# Patient Record
Sex: Male | Born: 1980
Health system: Southern US, Community
[De-identification: ages and names within clinical notes are randomized; demographics above are authoritative.]

## PROBLEM LIST (undated history)

## (undated) DIAGNOSIS — K219 Gastro-esophageal reflux disease without esophagitis: Secondary | ICD-10-CM

## (undated) HISTORY — DX: Gastro-esophageal reflux disease without esophagitis: K21.9

## (undated) HISTORY — PX: KNEE SURGERY: SHX244

---

## 1999-12-14 ENCOUNTER — Encounter: Payer: Self-pay | Admitting: *Deleted

## 1999-12-14 ENCOUNTER — Emergency Department (HOSPITAL_COMMUNITY): Admission: EM | Admit: 1999-12-14 | Discharge: 1999-12-14 | Payer: Self-pay | Admitting: *Deleted

## 1999-12-15 ENCOUNTER — Emergency Department (HOSPITAL_COMMUNITY): Admission: EM | Admit: 1999-12-15 | Discharge: 1999-12-15 | Payer: Self-pay | Admitting: Emergency Medicine

## 2002-03-25 ENCOUNTER — Emergency Department (HOSPITAL_COMMUNITY): Admission: EM | Admit: 2002-03-25 | Discharge: 2002-03-25 | Payer: Self-pay | Admitting: Emergency Medicine

## 2002-03-25 ENCOUNTER — Encounter: Payer: Self-pay | Admitting: Emergency Medicine

## 2006-02-13 ENCOUNTER — Emergency Department (HOSPITAL_COMMUNITY): Admission: EM | Admit: 2006-02-13 | Discharge: 2006-02-13 | Payer: Self-pay | Admitting: Emergency Medicine

## 2006-05-25 HISTORY — PX: KNEE SURGERY: SHX244

## 2006-06-07 ENCOUNTER — Encounter: Admission: RE | Admit: 2006-06-07 | Discharge: 2006-06-07 | Payer: Self-pay | Admitting: Orthopaedic Surgery

## 2006-06-24 ENCOUNTER — Encounter: Admission: RE | Admit: 2006-06-24 | Discharge: 2006-06-24 | Payer: Self-pay | Admitting: Orthopaedic Surgery

## 2007-07-31 ENCOUNTER — Emergency Department (HOSPITAL_COMMUNITY): Admission: EM | Admit: 2007-07-31 | Discharge: 2007-07-31 | Payer: Self-pay | Admitting: Emergency Medicine

## 2007-08-18 ENCOUNTER — Ambulatory Visit (HOSPITAL_BASED_OUTPATIENT_CLINIC_OR_DEPARTMENT_OTHER): Admission: RE | Admit: 2007-08-18 | Discharge: 2007-08-18 | Payer: Self-pay | Admitting: Orthopedic Surgery
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
## 2011-02-16 LAB — POCT HEMOGLOBIN-HEMACUE: Hemoglobin: 17.3 — ABNORMAL HIGH

## 2014-12-13 ENCOUNTER — Encounter: Payer: Self-pay | Admitting: Medical

## 2014-12-13 ENCOUNTER — Ambulatory Visit (INDEPENDENT_AMBULATORY_CARE_PROVIDER_SITE_OTHER): Payer: PRIVATE HEALTH INSURANCE | Admitting: Medical

## 2014-12-13 VITALS — BP 105/72 | HR 96 | Temp 98.0°F | Ht 72.75 in | Wt 300.0 lb

## 2014-12-13 DIAGNOSIS — H6500 Acute serous otitis media, unspecified ear: Secondary | ICD-10-CM

## 2014-12-13 DIAGNOSIS — J301 Allergic rhinitis due to pollen: Secondary | ICD-10-CM | POA: Diagnosis not present

## 2014-12-13 DIAGNOSIS — Z720 Tobacco use: Secondary | ICD-10-CM | POA: Diagnosis not present

## 2014-12-13 DIAGNOSIS — J309 Allergic rhinitis, unspecified: Secondary | ICD-10-CM | POA: Insufficient documentation

## 2014-12-13 DIAGNOSIS — F17200 Nicotine dependence, unspecified, uncomplicated: Secondary | ICD-10-CM | POA: Insufficient documentation

## 2014-12-13 MED ORDER — FLUTICASONE PROPIONATE 50 MCG/ACT NA SUSP: 2.0000 | Freq: Every day | NASAL | Status: DC

## 2014-12-13 MED ORDER — LORATADINE 10 MG PO TABS: 10.0000 mg | ORAL_TABLET | Freq: Every day | ORAL | Status: DC

## 2014-12-13 MED ORDER — BUPROPION HCL ER (SR) 150 MG PO TB12: ORAL_TABLET | ORAL | Status: DC

## 2014-12-13 MED ORDER — CEFDINIR 300 MG PO CAPS: 300.0000 mg | ORAL_CAPSULE | Freq: Two times a day (BID) | ORAL | Status: DC

## 2014-12-13 NOTE — Patient Instructions (Signed)
Smoker tyring to quit and discussed wellbutrin and insrtuction how to use. No contraindications to use.  Allergic rhinitis By signs, symptoms and exam appear to have allergies. Rx claritin and flonase.   Lt OM- Rx cefdnir  Follow up in 10 days recheck ear or as needed.

## 2014-12-13 NOTE — Assessment & Plan Note (Signed)
By signs, symptoms and exam appear to have allergies. Rx claritin and flonase.
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
## 2014-12-13 NOTE — Progress Notes (Signed)
Pre visit review using our clinic review tool, if applicable. No additional management support is needed unless otherwise documented below in the visit note. 

## 2014-12-13 NOTE — Assessment & Plan Note (Signed)
tyring to quit and discussed wellbutrin and insrtuction how to use. No contraindications to use.

## 2016-09-28 ENCOUNTER — Telehealth: Payer: Self-pay | Admitting: Medical

## 2016-09-28 NOTE — Telephone Encounter (Signed)
Patient Name: Jon Sanders  DOB: 12/19/1980    Initial Comment Caller states he's having some chest discomfort. Also having knee pain. Feet are falling asleep.   Nurse Assessment  Nurse: Stefano GaulStringer, RN, Dwana CurdVera Date/Time (Eastern Time): 09/28/2016 1:19:36 PM  Confirm and document reason for call. If symptomatic, describe symptoms. ---Caller states he has chest discomfort on the right side of his chest and in the center of his chest which occurred about 15 min ago. Pain lasts about 15 seconds. No chest pain until today. Has had chest discomfort off and on since 9:30 am. Has pain in both knees. Left knee is a little swollen. feet have been going numb for a few weeks which last about 5-10 min. Knee pain can be as high as 7-8.  Does the patient have any new or worsening symptoms? ---Yes  Will a triage be completed? ---Yes  Related visit to physician within the last 2 weeks? ---No  Does the PT have any chronic conditions? (i.e. diabetes, asthma, etc.) ---No  Is this a behavioral health or substance abuse call? ---No     Guidelines    Guideline Title Affirmed Question Affirmed Notes  Chest Pain [1] Chest pain lasting <= 5 minutes AND [2] NO chest pain or cardiac symptoms now (Exceptions: pains lasting a few seconds)   Knee Pain [1] MODERATE pain (e.g., interferes with normal activities, limping) AND [2] present > 3 days    Final Disposition User   See Physician within 24 Hours Park LayneStringer, RN, Dwana CurdVera    Comments  appt scheduled for 8 am on 09/29/2016 with Ramon DredgeEdward Saguier   Referrals  REFERRED TO PCP OFFICE   Disagree/Comply: Comply    Disagree/Comply: Comply

## 2016-09-28 NOTE — Telephone Encounter (Signed)
Wife Jon Cruel(Starr) called stating patient is experiencing sharp pains in his chest. Transferred to [facility]

## 2016-09-29 ENCOUNTER — Encounter: Payer: Self-pay | Admitting: Medical

## 2016-09-29 ENCOUNTER — Ambulatory Visit (INDEPENDENT_AMBULATORY_CARE_PROVIDER_SITE_OTHER): Payer: PRIVATE HEALTH INSURANCE | Admitting: Medical

## 2016-09-29 ENCOUNTER — Ambulatory Visit (HOSPITAL_BASED_OUTPATIENT_CLINIC_OR_DEPARTMENT_OTHER)
Admission: RE | Admit: 2016-09-29 | Discharge: 2016-09-29 | Disposition: A | Discharge status: Home / Self Care | Payer: Self-pay | Source: Ambulatory Visit | Attending: Medical | Admitting: Medical

## 2016-09-29 VITALS — BP 118/85 | HR 93 | Temp 98.0°F | Resp 16 | Ht 72.0 in | Wt 274.0 lb

## 2016-09-29 DIAGNOSIS — R062 Wheezing: Secondary | ICD-10-CM

## 2016-09-29 DIAGNOSIS — M25562 Pain in left knee: Secondary | ICD-10-CM

## 2016-09-29 DIAGNOSIS — M940 Chondrocostal junction syndrome [Tietze]: Secondary | ICD-10-CM

## 2016-09-29 DIAGNOSIS — F17200 Nicotine dependence, unspecified, uncomplicated: Secondary | ICD-10-CM

## 2016-09-29 DIAGNOSIS — R0789 Other chest pain: Secondary | ICD-10-CM

## 2016-09-29 DIAGNOSIS — G8929 Other chronic pain: Secondary | ICD-10-CM

## 2016-09-29 LAB — TROPONIN I: TNIDX: 0.01 ug/l (ref 0.00–0.06)

## 2016-09-29 MED ORDER — DICLOFENAC SODIUM 75 MG PO TBEC: 75.0000 mg | DELAYED_RELEASE_TABLET | Freq: Two times a day (BID) | ORAL | 0 refills | Status: DC

## 2016-09-29 MED FILL — DICLOFENAC SOD 75 MG TAB EC: 75 | 15 days supply | Qty: 30 | Fill #0

## 2016-09-29 NOTE — Telephone Encounter (Signed)
Can wear his brace for 5-7 days. Use diclofenac and then update us on how his knee feels.

## 2016-09-29 NOTE — Patient Instructions (Signed)
For your atypical chest pain will get troponin stat today. Ekg looked normal. Your location and transient features of pain indicated more muscle, cartilage or nerve pain. If you pain is constant, pressure like or associated cardiac symptoms like as explained then ED evaluation.  Do want to assess cardiac risk factors in near future. When you get your insurance will get CPE and include lipid panel.  Smoking is one risk factor for heart and best to stop or reduce significantly. Good job on weight loss.  For atypical chest pan and knee pain rx diclofenac. No nsaids otc.  Will go ahead and get cxr today for hx of smoking and get left knee xray.  Regarding your occupation if any pain behind knees/back of leg region with calf swelling would need ultrasounds.  After knee xray if joint space narrowed consider referral to ortho  Follow up in 2-3 weeks or as needed

## 2016-09-29 NOTE — Progress Notes (Signed)
Pre visit review using our clinic review tool, if applicable. No additional management support is needed unless otherwise documented below in the visit note. 

## 2016-09-29 NOTE — Progress Notes (Signed)
Subjective:    Patient ID: Jon Sanders, male    DOB: [birth year], 36 y.o.   MRN: [id number]  HPI   Pt in with some chest discomfort that had been coming and going since yesterday. Pt states when has pain will be sharp and stinging. Will last for 2-3 seconds at most. Last time he had pain was this morning at 6:30 am. Yesterday symptoms of transient sharp pain was lasting 2-3 seconds and sharp. Would occur about every 15 minutes. No pressure sensation. No shortness. No jaw pain, no arm pain or shoulder pain.   Pt had some bilateral knee pain yesterday. Worse on left side. Pt states crepitus when he walks. This past year had off and on knee pain. Left knee swelling. Pt had arthroscopic surgery in past about 8-9 years ago.  Pt has tried to loose to help with knees. He is eating healthier.  Pt is trying to quit smoking.   Pt mom had arrythmia. Uncle had CAD. No history of sudden death on either side of family.     Review of Systems  Constitutional: Negative for chills and fatigue.  Respiratory: Negative for cough, chest tightness, shortness of breath and wheezing.   Cardiovascular: Positive for chest pain. Negative for palpitations.       Very atypical chest pain. More right side.  Gastrointestinal: Negative for abdominal pain.  Musculoskeletal: Negative for back pain, joint swelling, myalgias, neck pain and neck stiffness.  Skin: Negative for pallor and rash.  Neurological: Negative for dizziness, seizures, weakness, numbness and headaches.  Hematological: Negative for adenopathy. Does not bruise/bleed easily.  Psychiatric/Behavioral: Negative for behavioral problems and confusion.       Objective:   Physical Exam  General Mental Status- Alert. General Appearance- Not in acute distress.   Skin General: Color- Normal Color. Moisture- Normal Moisture.  Neck Carotid Arteries- Normal color. Moisture- Normal Moisture. No carotid bruits. No JVD.  Chest and Lung  Exam Auscultation: Breath Sounds:-Normal.  Cardiovascular Auscultation:Rythm- Regular. Murmurs & Other Heart Sounds:Auscultation of the heart reveals- No Murmurs.  Abdomen Inspection:-Inspeection Normal. Palpation/Percussion:Note:No mass. Palpation and Percussion of the abdomen reveal- Non Tender, Non Distended + BS, no rebound or guarding.  Neurologic Cranial Nerve exam:- CN III-XII intact(No nystagmus), symmetric smile. Strength:- 5/5 equal and symmetric strength both upper and lower extremities.  Lower ext- calfs symmetric. No pedal edema. Negative homans signs.  Anterior chest- no pain on palpation. Pt local of pain rt side costochondral junction when he has pain.     Assessment & Plan:  ekg nsr  For your atypical chest pain will get troponin stat today. Ekg looked normal. Your location and transient features of pain indicated more muscle, cartilage or nerve pain. If you pain is constant, pressure like or associated cardiac symptoms like as explained then ED evaluation.  Do want to assess cardiac risk factors in near future. When you get your insurance will get CPE and include lipid panel.  Smoking is one risk factor for heart and best to stop or reduce significantly. Good job on weight loss.  For atypical chest pan and knee pain rx diclofenac. No nsaids otc.  Will go ahead and get cxr today for hx of smoking and get left knee xray.  Regarding your occupation if any pain behind knees/back of leg region with calf swelling would need ultrasounds.  After knee xray if joint space narrowed consider referral to ortho  Follow up in 2-3 weeks or as needed  Abie Killian, Ramon DredgeEdward, VF CorporationPA-C

## 2016-09-30 ENCOUNTER — Telehealth: Payer: Self-pay | Admitting: Medical

## 2016-09-30 NOTE — Telephone Encounter (Signed)
Notified spouse of results.

## 2016-09-30 NOTE — Telephone Encounter (Signed)
Notified pt that he can wear brace for 5-7 days and to updates us on how his knee is feeling.

## 2016-09-30 NOTE — Telephone Encounter (Signed)
Pt's spouse called in to request pt's chest x-ray results   CB: [phone]

## 2017-10-19 IMAGING — DX DG KNEE 3 VIEWS*L*
3 series · 3 of 3 positions shown · non-contrast
Comparison: None

CLINICAL DATA: Chronic LEFT knee pain for over year

EXAM:
LEFT KNEE - 3 VIEW

[knee ap]
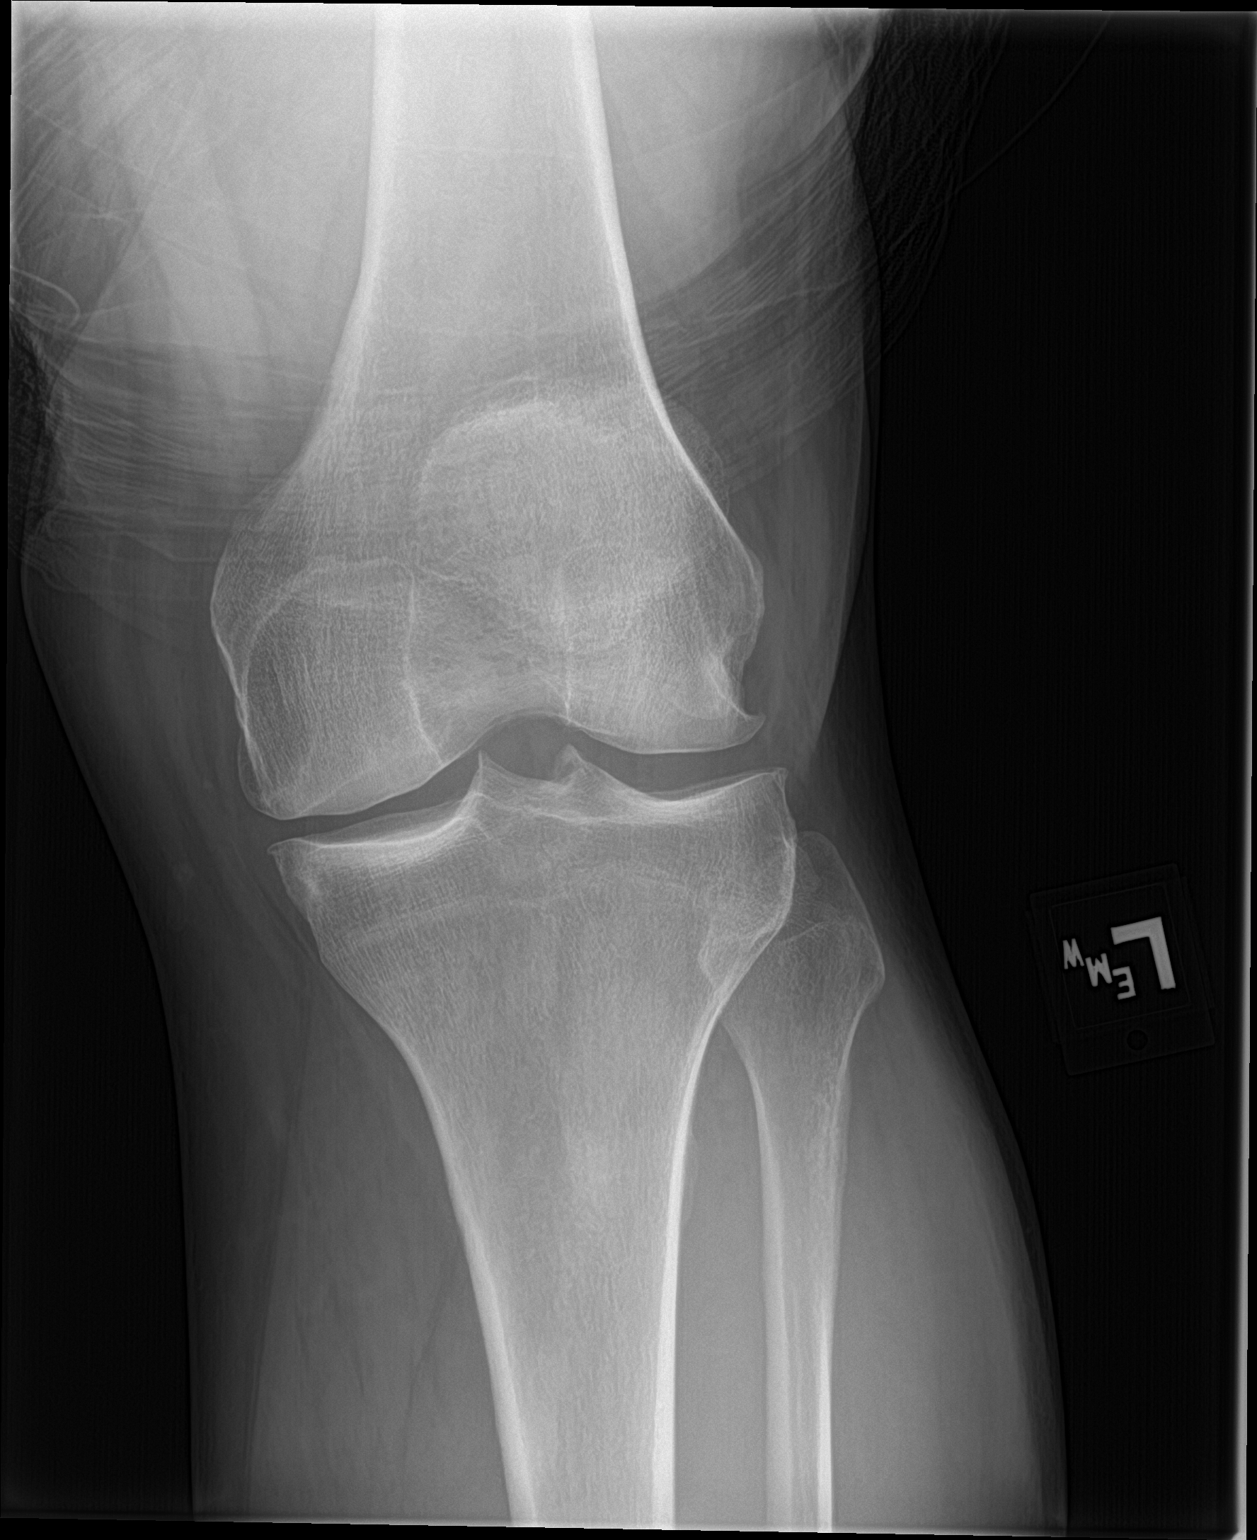

[knee lat]
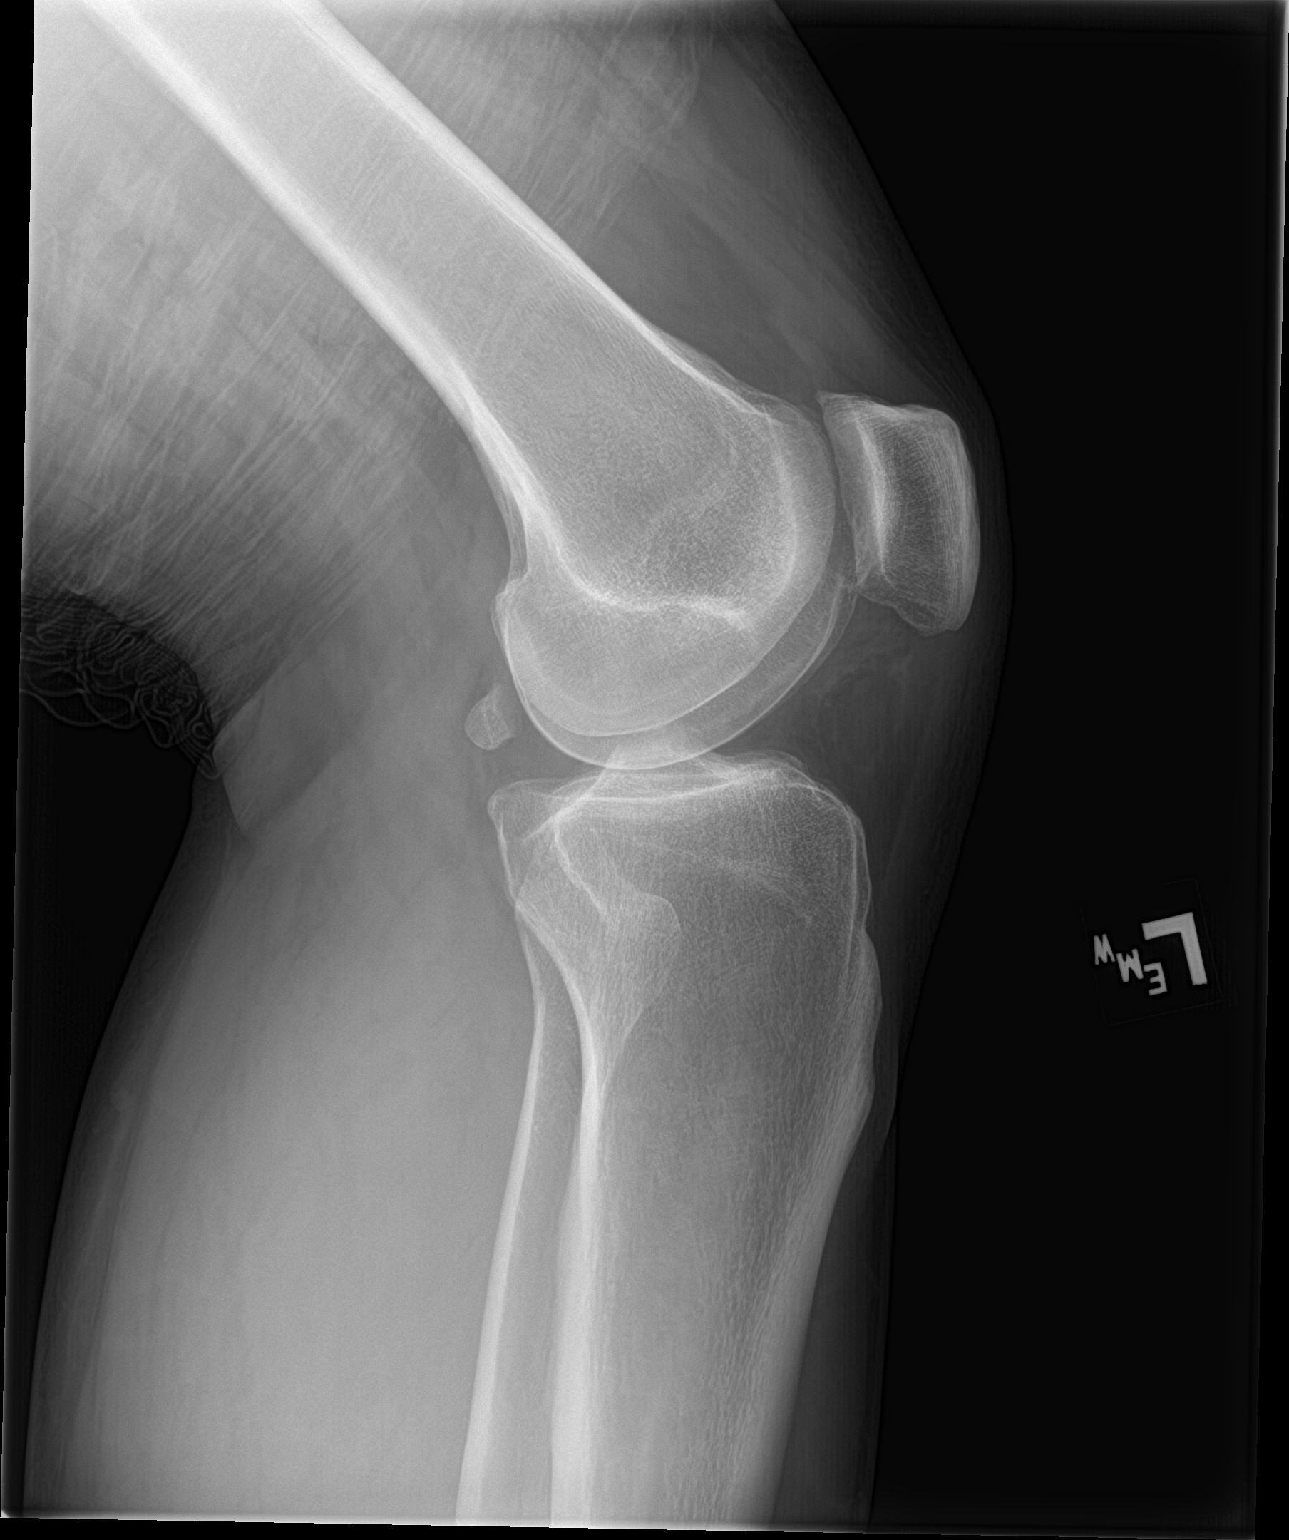

[knee sunrise]
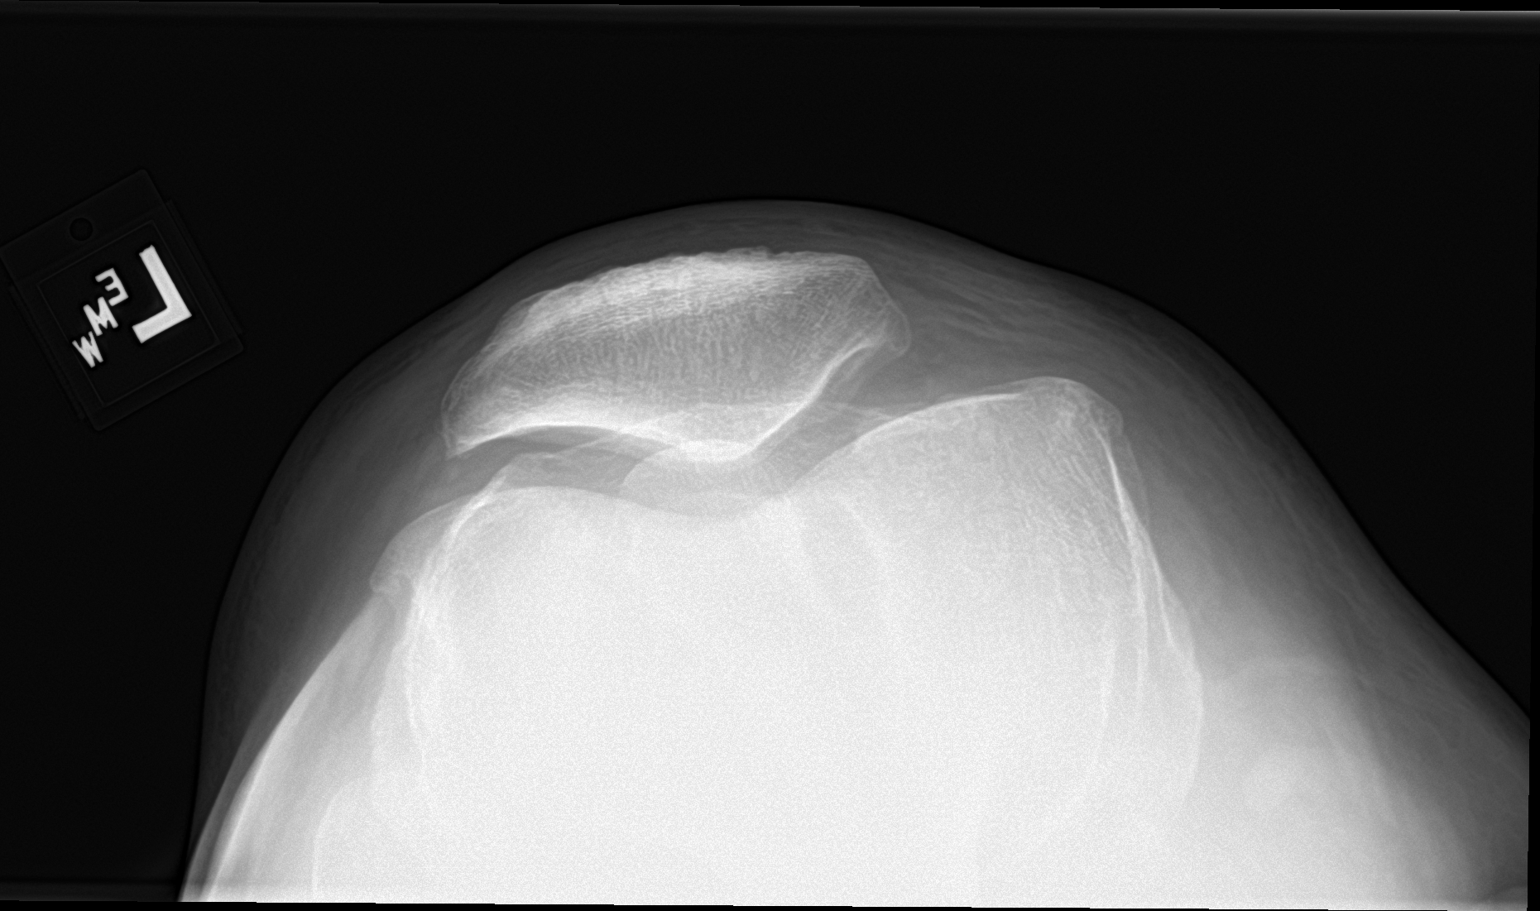

[3 of 3 positions shown; findings below may reference images not displayed]

FINDINGS: Osseous mineralization normal.

Mild joint space narrowing greatest at medial compartment.

No acute fracture, dislocation, or bone destruction.

Clothing artifacts at distal thigh.

No knee joint effusion or other regional soft tissue abnormality.
IMPRESSION: Mild degenerative changes LEFT knee.

## 2020-11-26 ENCOUNTER — Other Ambulatory Visit: Payer: Self-pay

## 2020-11-27 ENCOUNTER — Encounter: Payer: Self-pay | Admitting: Medical

## 2020-11-27 ENCOUNTER — Ambulatory Visit (INDEPENDENT_AMBULATORY_CARE_PROVIDER_SITE_OTHER): Payer: PRIVATE HEALTH INSURANCE | Admitting: Medical

## 2020-11-27 ENCOUNTER — Other Ambulatory Visit: Payer: Self-pay

## 2020-11-27 ENCOUNTER — Other Ambulatory Visit (HOSPITAL_BASED_OUTPATIENT_CLINIC_OR_DEPARTMENT_OTHER): Payer: Self-pay

## 2020-11-27 VITALS — BP 115/57 | HR 100 | Resp 18 | Ht 72.0 in | Wt 328.0 lb

## 2020-11-27 DIAGNOSIS — F17200 Nicotine dependence, unspecified, uncomplicated: Secondary | ICD-10-CM | POA: Diagnosis not present

## 2020-11-27 DIAGNOSIS — K219 Gastro-esophageal reflux disease without esophagitis: Secondary | ICD-10-CM

## 2020-11-27 DIAGNOSIS — Z23 Encounter for immunization: Secondary | ICD-10-CM

## 2020-11-27 DIAGNOSIS — H9209 Otalgia, unspecified ear: Secondary | ICD-10-CM

## 2020-11-27 DIAGNOSIS — Z0000 Encounter for general adult medical examination without abnormal findings: Secondary | ICD-10-CM

## 2020-11-27 DIAGNOSIS — Z0001 Encounter for general adult medical examination with abnormal findings: Secondary | ICD-10-CM

## 2020-11-27 DIAGNOSIS — R5383 Other fatigue: Secondary | ICD-10-CM | POA: Diagnosis not present

## 2020-11-27 DIAGNOSIS — Z7185 Encounter for immunization safety counseling: Secondary | ICD-10-CM

## 2020-11-27 DIAGNOSIS — E669 Obesity, unspecified: Secondary | ICD-10-CM

## 2020-11-27 LAB — LIPID PANEL
Cholesterol: 249 mg/dL — ABNORMAL HIGH (ref 0–200)
HDL: 45.1 mg/dL (ref >39.00)
LDL Cholesterol: 179 mg/dL — ABNORMAL HIGH (ref 0–99)
NonHDL: 204.38
Total CHOL/HDL Ratio: 6
Triglycerides: 126 mg/dL (ref 0.0–149.0)
VLDL: 25.2 mg/dL (ref 0.0–40.0)

## 2020-11-27 LAB — CBC WITH DIFFERENTIAL/PLATELET
Basophils Absolute: 0.1 10*3/uL (ref 0.0–0.1)
Basophils Relative: 0.8 % (ref 0.0–3.0)
Eosinophils Absolute: 0.2 10*3/uL (ref 0.0–0.7)
Eosinophils Relative: 1.8 % (ref 0.0–5.0)
HCT: 46.4 % (ref 39.0–52.0)
Hemoglobin: 16.2 g/dL (ref 13.0–17.0)
Lymphocytes Relative: 26.5 % (ref 12.0–46.0)
Lymphs Abs: 3.2 10*3/uL (ref 0.7–4.0)
MCHC: 34.9 g/dL (ref 30.0–36.0)
MCV: 97.2 fl (ref 78.0–100.0)
Monocytes Absolute: 0.9 10*3/uL (ref 0.1–1.0)
Monocytes Relative: 7.7 % (ref 3.0–12.0)
Neutro Abs: 7.7 10*3/uL (ref 1.4–7.7)
Neutrophils Relative %: 63.2 % (ref 43.0–77.0)
Platelets: 162 10*3/uL (ref 150.0–400.0)
RBC: 4.77 Mil/uL (ref 4.22–5.81)
RDW: 13.5 % (ref 11.5–15.5)
WBC: 12.2 10*3/uL — ABNORMAL HIGH (ref 4.0–10.5)

## 2020-11-27 LAB — COMPREHENSIVE METABOLIC PANEL
ALT: 25 U/L (ref 0–53)
AST: 19 U/L (ref 0–37)
Albumin: 4.3 g/dL (ref 3.5–5.2)
Alkaline Phosphatase: 80 U/L (ref 39–117)
BUN: 14 mg/dL (ref 6–23)
CO2: 27 mEq/L (ref 19–32)
Calcium: 9.2 mg/dL (ref 8.4–10.5)
Chloride: 104 mEq/L (ref 96–112)
Creatinine, Ser: 1.01 mg/dL (ref 0.40–1.50)
GFR: 91.76 mL/min (ref >60.00)
Glucose, Bld: 80 mg/dL (ref 70–99)
Potassium: 4.4 mEq/L (ref 3.5–5.1)
Sodium: 139 mEq/L (ref 135–145)
Total Bilirubin: 0.6 mg/dL (ref 0.2–1.2)
Total Protein: 7 g/dL (ref 6.0–8.3)

## 2020-11-27 LAB — T4, FREE: Free T4: 0.82 ng/dL (ref 0.60–1.60)

## 2020-11-27 LAB — TSH: TSH: 3.25 u[IU]/mL (ref 0.35–5.50)

## 2020-11-27 LAB — VITAMIN B12: Vitamin B-12: 293 pg/mL (ref 211–911)

## 2020-11-27 MED ORDER — NEOMYCIN-POLYMYXIN-HC 3.5-10000-1 OT SOLN
3.0000 [drp] | Freq: Three times a day (TID) | OTIC | 0 refills | Status: DC
  Filled 2020-11-27: qty 10, 22d supply, fill #0

## 2020-11-27 MED ORDER — OMEPRAZOLE 20 MG PO CPDR
20.0000 mg | DELAYED_RELEASE_CAPSULE | Freq: Every day | ORAL | 3 refills | Status: DC
  Filled 2020-11-27: qty 30, 30d supply, fill #0

## 2020-11-27 MED ORDER — AMOXICILLIN-POT CLAVULANATE 875-125 MG PO TABS
1.0000 | ORAL_TABLET | Freq: Two times a day (BID) | ORAL | 0 refills | Status: DC
  Filled 2020-11-27: qty 20, 10d supply, fill #0

## 2020-11-27 MED ORDER — BUPROPION HCL ER (XL) 150 MG PO TB24
150.0000 mg | ORAL_TABLET | Freq: Every day | ORAL | 1 refills | Status: DC
  Filled 2020-11-27: qty 30, 30d supply, fill #0
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
## 2020-11-27 NOTE — Addendum Note (Signed)
Addended by: Maximino Sarin on: 11/27/2020 10:36 AM   Modules accepted: Orders

## 2020-11-27 NOTE — Patient Instructions (Addendum)
For you wellness exam today I have ordered cbc, cmp and lipid panel.  For fatigue will get b12, b1, tsh, t4 and vit d.  Recommend exercise and healthy diet.  We will let you know lab results as they come in.  Follow up date appointment will be determined after lab review.    For smoking cessation rx wellbutrin.  For wt loss recommend [employer] app and exercise.  For gerd type symptoms rx omeprazole. If your upper throat/potential esophagus symptoms don't resolve then refer to GI for possible endoscopy.  For ear pain OM with some otitis externa rx augmentin and cortipsorin otic drops.  Follow up in 3 weeks or as needed   Preventive Care 84-21 Years Old, Male Preventive care refers to lifestyle choices and visits with your health care provider that can promote health and wellness. This includes: A yearly physical exam. This is also called an annual wellness visit. Regular dental and eye exams. Immunizations. Screening for certain conditions. Healthy lifestyle choices, such as: Eating a healthy diet. Getting regular exercise. Not using drugs or products that contain nicotine and tobacco. Limiting alcohol use. What can I expect for my preventive care visit? Physical exam Your health care provider will check your: Height and weight. These may be used to calculate your BMI (body mass index). BMI is a measurement that tells if you are at a healthy weight. Heart rate and blood pressure. Body temperature. Skin for abnormal spots. Counseling Your health care provider may ask you questions about your: Past medical problems. Family's medical history. Alcohol, tobacco, and drug use. Emotional well-being. Home life and relationship well-being. Sexual activity. Diet, exercise, and sleep habits. Work and work Astronomer. Access to firearms. What immunizations do I need?  Vaccines are usually given at various ages, according to a schedule. Your health care provider will recommend  vaccines for you based on your age, medicalhistory, and lifestyle or other factors, such as travel or where you work. What tests do I need? Blood tests Lipid and cholesterol levels. These may be checked every 5 years, or more often if you are over 61 years old. Hepatitis C test. Hepatitis B test. Screening Lung cancer screening. You may have this screening every year starting at age 59 if you have a 30-pack-year history of smoking and currently smoke or have quit within the past 15 years. Prostate cancer screening. Recommendations will vary depending on your family history and other risks. Genital exam to check for testicular cancer or hernias. Colorectal cancer screening. All adults should have this screening starting at age 92 and continuing until age 47. Your health care provider may recommend screening at age 66 if you are at increased risk. You will have tests every 1-10 years, depending on your results and the type of screening test. Diabetes screening. This is done by checking your blood sugar (glucose) after you have not eaten for a while (fasting). You may have this done every 1-3 years. STD (sexually transmitted disease) testing, if you are at risk. Follow these instructions at home: Eating and drinking  Eat a diet that includes fresh fruits and vegetables, whole grains, lean protein, and low-fat dairy products. Take vitamin and mineral supplements as recommended by your health care provider. Do not drink alcohol if your health care provider tells you not to drink. If you drink alcohol: Limit how much you have to 0-2 drinks a day. Be aware of how much alcohol is in your drink. In the [place] one drink equals one 12  oz bottle of beer (355 mL), one 5 oz glass of wine (148 mL), or one 1 oz glass of hard liquor (44 mL).  Lifestyle Take daily care of your teeth and gums. Brush your teeth every morning and night with fluoride toothpaste. Floss one time each day. Stay active. Exercise  for at least 30 minutes 5 or more days each week. Do not use any products that contain nicotine or tobacco, such as cigarettes, e-cigarettes, and chewing tobacco. If you need help quitting, ask your health care provider. Do not use drugs. If you are sexually active, practice safe sex. Use a condom or other form of protection to prevent STIs (sexually transmitted infections). If told by your health care provider, take low-dose aspirin daily starting at age 1. Find healthy ways to cope with stress, such as: Meditation, yoga, or listening to music. Journaling. Talking to a trusted person. Spending time with friends and family. Safety Always wear your seat belt while driving or riding in a vehicle. Do not drive: If you have been drinking alcohol. Do not ride with someone who has been drinking. When you are tired or distracted. While texting. Wear a helmet and other protective equipment during sports activities. If you have firearms in your house, make sure you follow all gun safety procedures. What's next? Go to your health care provider once a year for an annual wellness visit. Ask your health care provider how often you should have your eyes and teeth checked. Stay up to date on all vaccines. This information is not intended to replace advice given to you by your health care provider. Make sure you discuss any questions you have with your healthcare provider. Document Revised: 02/07/2019 Document Reviewed: 05/05/2018 Elsevier Patient Education  2022 [employer].

## 2020-11-29 ENCOUNTER — Encounter: Payer: Self-pay | Admitting: Medical

## 2020-11-30 LAB — VITAMIN D 1,25 DIHYDROXY
Vitamin D 1, 25 (OH)2 Total: 39 pg/mL (ref 18–72)
Vitamin D2 1, 25 (OH)2: <8 pg/mL
Vitamin D3 1, 25 (OH)2: 39 pg/mL

## 2020-12-01 LAB — VITAMIN B1: Vitamin B1 (Thiamine): 7 nmol/L — ABNORMAL LOW (ref 8–30)

## 2020-12-02 ENCOUNTER — Encounter: Payer: Self-pay | Admitting: Medical

## 2020-12-06 NOTE — Addendum Note (Signed)
Addended by: Gwenevere Abbot on: 12/06/2020 11:11 PM   Modules accepted: Orders

## 2020-12-13 ENCOUNTER — Encounter: Payer: Self-pay | Admitting: Medical

## 2020-12-19 ENCOUNTER — Ambulatory Visit: Payer: PRIVATE HEALTH INSURANCE | Admitting: Medical

## 2020-12-24 ENCOUNTER — Ambulatory Visit (INDEPENDENT_AMBULATORY_CARE_PROVIDER_SITE_OTHER): Payer: Self-pay | Admitting: Family Medicine

## 2020-12-27 ENCOUNTER — Other Ambulatory Visit (HOSPITAL_BASED_OUTPATIENT_CLINIC_OR_DEPARTMENT_OTHER): Payer: Self-pay

## 2021-01-02 ENCOUNTER — Ambulatory Visit: Payer: PRIVATE HEALTH INSURANCE | Admitting: Medical

## 2021-01-07 ENCOUNTER — Ambulatory Visit (INDEPENDENT_AMBULATORY_CARE_PROVIDER_SITE_OTHER): Payer: Self-pay | Admitting: Family Medicine

## 2021-01-15 ENCOUNTER — Other Ambulatory Visit (HOSPITAL_BASED_OUTPATIENT_CLINIC_OR_DEPARTMENT_OTHER): Payer: Self-pay

## 2021-01-15 ENCOUNTER — Telehealth (INDEPENDENT_AMBULATORY_CARE_PROVIDER_SITE_OTHER): Payer: PRIVATE HEALTH INSURANCE | Admitting: Medical

## 2021-01-15 ENCOUNTER — Other Ambulatory Visit: Payer: Self-pay

## 2021-01-15 DIAGNOSIS — H9202 Otalgia, left ear: Secondary | ICD-10-CM | POA: Diagnosis not present

## 2021-01-15 DIAGNOSIS — E7849 Other hyperlipidemia: Secondary | ICD-10-CM

## 2021-01-15 DIAGNOSIS — R5383 Other fatigue: Secondary | ICD-10-CM | POA: Diagnosis not present

## 2021-01-15 DIAGNOSIS — K219 Gastro-esophageal reflux disease without esophagitis: Secondary | ICD-10-CM

## 2021-01-15 DIAGNOSIS — E669 Obesity, unspecified: Secondary | ICD-10-CM | POA: Diagnosis not present

## 2021-01-15 MED ORDER — BUPROPION HCL ER (XL) 150 MG PO TB24
150.0000 mg | ORAL_TABLET | Freq: Every day | ORAL | 1 refills | Status: DC
  Filled 2021-01-15: qty 30, 30d supply, fill #0

## 2021-01-15 NOTE — Progress Notes (Signed)
   Subjective:    Patient ID: Jon Sanders, male    DOB: 1981-03-20, 40 y.o.   MRN: [id number]  HPI  Virtual Visit via Video Note  I connected with Jon Sanders on 01/15/21 at 10:40 AM EDT by a video enabled telemedicine application and verified that I am speaking with the correct person using two identifiers.  Location: Patient: home Provider: office   I discussed the limitations of evaluation and management by telemedicine and the availability of in person appointments. The patient expressed understanding and agreed to proceed.  History of Present Illness:  Pt states his gerd symptoms did resolve with omeprazole.  Pt states that his ear pain did not get better(feels the same). I had given augmentin and cortipsorin otic drops. Pt mentions history of rt side tmj area pain. Pt has seen dentist about cracked tooth left side. About 1.5 inches from the tmj region. Told by dentist no infection. Pt does chew candies. Some ice chewing.   Pt is still smoking. He is on wellbutrin. He smokes about 1/2-1 pack a day. At one point was 2 packs. Pt states mood feels better with wellbutrin.  Pt states fatigue is less/better energy with b1 and b12.  Pt  is working on low cholesterol diet.  Pt has gained weight despite loss. Failed ww effort.    Observations/Objective:  General-no acute distress, pleasant, oriented. Lungs- on inspection lungs appear unlabored. Neck- no tracheal deviation or jvd on inspection. Neuro- gross motor function appears intact.    Assessment and Plan: Fatigue improving since use of b12 and b1 vitamin otc. Continue those daily and recheck those level in 2 months.  Left ear pain persist despite tx. Will refer to ent for evaluation. TMJ and cracked tooth in differential but since you don't report severe pain in tooth or tmj region will refer to ent. Get opinion from dentist about tmj joint region appearance on recent xray.   Continue wellbutrin for smoking  cessation.  Low cholesterol diet and recheck cmp with lipid panel in 2 months.  For wt loss referred to wt management clinic.  Follow up date to be determined after lab review in 2 months or sooner if needed  Esperanza Richters, PA-C   Follow Up Instructions:    I discussed the assessment and treatment plan with the patient. The patient was provided an opportunity to ask questions and all were answered. The patient agreed with the plan and demonstrated an understanding of the instructions.   The patient was advised to call back or seek an in-person evaluation if the symptoms worsen or if the condition fails to improve as anticipated.   Time spent with patient today was  32 minutes which consisted of chart review, discussing diagnosis, work up treatment and documentation.   Esperanza Richters, PA-C   Review of Systems     Objective:   Physical Exam        Assessment & Plan:

## 2021-01-15 NOTE — Patient Instructions (Addendum)
Fatigue improving since use of b12 and b1 vitamin otc. Continue those daily and recheck those level in 2 months.  Left ear pain persist despite tx. Will refer to ent for evaluation. TMJ and cracked tooth in differential but since you don't report severe pain in tooth or tmj region will refer to ent. Get opinion from dentist about tmj joint region appearance on recent xray.   Continue wellbutrin for smoking cessation.  Low cholesterol diet and recheck cmp with lipid panel in 2 months.  For wt loss referred to wt management clinic.  For gerd continue omeprazole.  Follow up date to be determined after lab review in 2 months or sooner if needed

## 2021-01-28 ENCOUNTER — Other Ambulatory Visit (HOSPITAL_BASED_OUTPATIENT_CLINIC_OR_DEPARTMENT_OTHER): Payer: Self-pay

## 2021-01-31 ENCOUNTER — Other Ambulatory Visit: Payer: Self-pay | Admitting: Medical

## 2021-05-28 ENCOUNTER — Encounter: Payer: Self-pay | Admitting: Medical

## 2021-05-28 ENCOUNTER — Other Ambulatory Visit: Payer: Self-pay | Admitting: Medical

## 2021-05-28 MED ORDER — BUPROPION HCL ER (XL) 150 MG PO TB24: 150.0000 mg | ORAL_TABLET | Freq: Every day | ORAL | 1 refills | Status: DC

## 2021-05-28 MED ORDER — OMEPRAZOLE 20 MG PO CPDR: 20.0000 mg | DELAYED_RELEASE_CAPSULE | Freq: Every day | ORAL | 3 refills | Status: DC

## 2021-12-31 ENCOUNTER — Encounter (INDEPENDENT_AMBULATORY_CARE_PROVIDER_SITE_OTHER): Payer: Self-pay

## 2023-03-12 ENCOUNTER — Ambulatory Visit: Payer: Medicaid Other | Admitting: Medical

## 2023-03-19 ENCOUNTER — Encounter: Payer: Self-pay | Admitting: Medical

## 2023-03-19 ENCOUNTER — Ambulatory Visit: Payer: Medicaid Other | Admitting: Medical

## 2023-03-19 VITALS — BP 114/76 | HR 87 | Temp 97.7°F | Ht 72.0 in | Wt 333.0 lb

## 2023-03-19 DIAGNOSIS — Z6841 Body Mass Index (BMI) 40.0 and over, adult: Secondary | ICD-10-CM

## 2023-03-19 DIAGNOSIS — E669 Obesity, unspecified: Secondary | ICD-10-CM

## 2023-03-19 DIAGNOSIS — R5383 Other fatigue: Secondary | ICD-10-CM

## 2023-03-19 DIAGNOSIS — G629 Polyneuropathy, unspecified: Secondary | ICD-10-CM

## 2023-03-19 DIAGNOSIS — Z0000 Encounter for general adult medical examination without abnormal findings: Secondary | ICD-10-CM

## 2023-03-19 NOTE — Patient Instructions (Addendum)
For you wellness exam today I have ordered cbc, cmp, and  lipid panel. Future labs within a week but fating  Vaccine declined  Recommend exercise and healthy diet.  We will let you know lab results as they come in.  Follow up date appointment will be determined after lab review.     Obesity Significant weight loss (35 lbs) over the past 3 months through calorie restriction ([phone] kcal/day), increased fluid intake, reduced sugar and greasy food intake, and increased physical activity. Noted improved mobility and energy levels. -Continue current diet and exercise regimen.  Tobacco Use Transitioned from smoking 2-3 packs of cigarettes daily to vaping in November of last year. -Encouraged to continue efforts to quit nicotine entirely. But better than smoking such large amounts. consider in future stop vaping.  Foot Pain Reports nerve-like pain on the top of the foot for the past month, particularly while driving. -Z61, b1 and tsh to start work up.   Your weight is different at home. Check your scale against wt. If accurate let me know will add cxr and bnp.  Follow up date to be determined after lab review.   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 -Continue current weight loss efforts to improve overall health status for DOT physical.

## 2023-03-19 NOTE — Progress Notes (Signed)
Subjective:    Patient ID: Jon Sanders, male    DOB: 19-Sep-1980, 42 y.o.   MRN: [id number]  HPI Decided to wellness exam today. 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 Healthy diet now, married- no children.Not drinking any alcohol. Now vaping  Flu  vaccine declined  Discussed the use of AI scribe software for clinical note transcription with the patient, who gave verbal consent to proceed.  History of Present Illness   The patient, a truck driver with obesity, has been actively trying to lose weight over the past three months. They have successfully lost 35 pounds through calorie counting, increased fluid intake, reduced sugar and greasy food consumption, and maintaining a regular eating schedule. Despite the constraints of their job, they have incorporated small workouts into their daily routine, such as walking around the truck and manually adjusting the landing gears. Their daily caloric intake ranges from 900 to 1500 calories, depending on their appetite.  In addition to their weight loss efforts, the patient has also made changes to their smoking habits. They transitioned from smoking two to three packs of cigarettes a day to vaping since November of the previous year.  The patient has noticed some improvements in their mobility since losing weight, such as being able to get up faster. However, they have also been experiencing a nerve-like pain on the top of their foot for about a month, which occurs while driving. Despite their weight loss, they still experience occasional fatigue.       Review of Systems  Constitutional:  Positive for fatigue.  HENT:  Negative for congestion.   Respiratory:  Negative for cough, chest tightness and wheezing.   Cardiovascular:  Negative for chest pain and palpitations.  Gastrointestinal:  Negative for abdominal pain, diarrhea and nausea.  Musculoskeletal:  Negative for back pain.  Neurological:         Neuropathy like pain.  Psychiatric/Behavioral:  Negative for dysphoric mood and suicidal ideas. The patient is not nervous/anxious.     No past medical history on file.   Social History   Socioeconomic History   Marital status: Married    Spouse name: Not on file   Number of children: Not on file   Years of education: Not on file   Highest education level: Not on file  Occupational History   Not on file  Tobacco Use   Smoking status: Every Day    Current packs/day: 1.00    Average packs/day: 1 pack/day for 20.0 years (20.0 ttl pk-yrs)    Types: Cigarettes   Smokeless tobacco: Never  Vaping Use   Vaping status: Never Used  Substance and Sexual Activity   Alcohol use: No    Alcohol/week: 0.0 standard drinks of alcohol   Drug use: No   Sexual activity: Yes  Other Topics Concern   Not on file  Social History Narrative   Not on file   Social Determinants of Health   Financial Resource Strain: Not on file  Food Insecurity: Not on file  Transportation Needs: Not on file  Physical Activity: Not on file  Stress: Not on file  Social Connections: Unknown (09/26/2021)   Received from [facility]   Social Network    Social Network: Not on file  Intimate Partner Violence: Unknown (08/25/2021)   Received from [facility]   HITS    Physically Hurt: Not on file    Insult or Talk Down To: Not on file  Threaten Physical Harm: Not on file    Scream or Curse: Not on file    Past Surgical History:  Procedure Laterality Date   KNEE SURGERY     Left    Family History  Problem Relation Age of Onset   Heart disease Mother     No Known Allergies  Current Outpatient Medications on File Prior to Visit  Medication Sig Dispense Refill   omeprazole (PRILOSEC) 20 MG capsule Take 1 capsule (20 mg total) by mouth daily. 30 capsule 3   No current facility-administered medications on file prior to visit.    BP 114/76   Pulse 87   Temp 97.7 F (36.5 C)   Ht 6' (1.829 m)    Wt (!) 333 lb (151 kg)   SpO2 97%   BMI 45.16 kg/m        Objective:   Physical Exam  General Mental Status- Alert. General Appearance- Not in acute distress.   Skin General: Color- Normal Color. Moisture- Normal Moisture.  Neck No thyromegaly  Chest and Lung Exam Auscultation: Breath Sounds:-Normal.  Cardiovascular Auscultation:Rythm- Regular. Murmurs & Other Heart Sounds:Auscultation of the heart reveals- No Murmurs.  Abdomen Inspection:-Inspeection Normal. Palpation/Percussion:Note:No mass. Palpation and Percussion of the abdomen reveal- Non Tender, Non Distended + BS, no rebound or guarding.   Neurologic Cranial Nerve exam:- CN III-XII intact(No nystagmus), symmetric smile. Strength:- 5/5 equal and symmetric strength both upper and lower extremities.   Lower ext- calfs not swollen. Symmetric, negative homans signs bilaterall.      Assessment & Plan:   Assessment and Plan    Patient Instructions  For you wellness exam today I have ordered cbc, cmp, and  lipid panel. Future labs within a week but fating  Vaccine declined  Recommend exercise and healthy diet.  We will let you know lab results as they come in.  Follow up date appointment will be determined after lab review.     Obesity Significant weight loss (35 lbs) over the past 3 months through calorie restriction ([id number] kcal/day), increased fluid intake, reduced sugar and greasy food intake, and increased physical activity. Noted improved mobility and energy levels. -Continue current diet and exercise regimen.  Tobacco Use Transitioned from smoking 2-3 packs of cigarettes daily to vaping in November of last year. -Encouraged to continue efforts to quit nicotine entirely. But better than smoking such large amounts. consider in future stop vaping.  Foot Pain Reports nerve-like pain on the top of the foot for the past month, particularly while driving. -V78, b1 and tsh to start work  up.   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 -Continue current weight loss efforts to improve overall health status for DOT physical.     Your weight is different at home. Check your scale against wt. If accurate let me know will add cxr and bnp.  Follow up date to be determined after lab review.   46962 charge as did address/discuss obesity, neuropathy like pain and fatigue.

## 2023-03-23 ENCOUNTER — Encounter: Payer: Self-pay | Admitting: Medical

## 2023-04-01 ENCOUNTER — Encounter: Payer: Self-pay | Admitting: Medical

## 2023-04-01 MED ORDER — METFORMIN HCL 500 MG PO TABS
500.0000 mg | ORAL_TABLET | Freq: Two times a day (BID) | ORAL | 0 refills | Status: DC
Start: 2023-04-01 — End: 2023-04-20

## 2023-04-01 NOTE — Addendum Note (Signed)
Addended by: Gwenevere Abbot on: 04/01/2023 08:10 PM   Modules accepted: Orders

## 2023-04-04 ENCOUNTER — Encounter (INDEPENDENT_AMBULATORY_CARE_PROVIDER_SITE_OTHER): Payer: Self-pay

## 2023-04-08 ENCOUNTER — Other Ambulatory Visit (INDEPENDENT_AMBULATORY_CARE_PROVIDER_SITE_OTHER): Payer: Medicaid Other

## 2023-04-08 DIAGNOSIS — G629 Polyneuropathy, unspecified: Secondary | ICD-10-CM | POA: Diagnosis not present

## 2023-04-08 DIAGNOSIS — Z0000 Encounter for general adult medical examination without abnormal findings: Secondary | ICD-10-CM

## 2023-04-08 DIAGNOSIS — R5383 Other fatigue: Secondary | ICD-10-CM

## 2023-04-08 LAB — LIPID PANEL
Cholesterol: 166 mg/dL (ref 0–200)
HDL: 32.1 mg/dL — ABNORMAL LOW (ref >39.00)
LDL Cholesterol: 113 mg/dL — ABNORMAL HIGH (ref 0–99)
NonHDL: 133.8
Total CHOL/HDL Ratio: 5
Triglycerides: 106 mg/dL (ref 0.0–149.0)
VLDL: 21.2 mg/dL (ref 0.0–40.0)

## 2023-04-08 LAB — COMPREHENSIVE METABOLIC PANEL
ALT: 26 U/L (ref 0–53)
AST: 17 U/L (ref 0–37)
Albumin: 4.2 g/dL (ref 3.5–5.2)
Alkaline Phosphatase: 80 U/L (ref 39–117)
BUN: 12 mg/dL (ref 6–23)
CO2: 28 meq/L (ref 19–32)
Calcium: 9.2 mg/dL (ref 8.4–10.5)
Chloride: 105 meq/L (ref 96–112)
Creatinine, Ser: 1.09 mg/dL (ref 0.40–1.50)
GFR: 82.36 mL/min (ref >60.00)
Glucose, Bld: 96 mg/dL (ref 70–99)
Potassium: 4 meq/L (ref 3.5–5.1)
Sodium: 141 meq/L (ref 135–145)
Total Bilirubin: 0.4 mg/dL (ref 0.2–1.2)
Total Protein: 6.9 g/dL (ref 6.0–8.3)

## 2023-04-08 LAB — CBC WITH DIFFERENTIAL/PLATELET
Basophils Absolute: 0.1 10*3/uL (ref 0.0–0.1)
Basophils Relative: 0.9 % (ref 0.0–3.0)
Eosinophils Absolute: 0.3 10*3/uL (ref 0.0–0.7)
Eosinophils Relative: 2.9 % (ref 0.0–5.0)
HCT: 46.9 % (ref 39.0–52.0)
Hemoglobin: 15.7 g/dL (ref 13.0–17.0)
Lymphocytes Relative: 28.6 % (ref 12.0–46.0)
Lymphs Abs: 2.8 10*3/uL (ref 0.7–4.0)
MCHC: 33.4 g/dL (ref 30.0–36.0)
MCV: 96.6 fL (ref 78.0–100.0)
Monocytes Absolute: 0.7 10*3/uL (ref 0.1–1.0)
Monocytes Relative: 7.5 % (ref 3.0–12.0)
Neutro Abs: 5.9 10*3/uL (ref 1.4–7.7)
Neutrophils Relative %: 60.1 % (ref 43.0–77.0)
Platelets: 194 10*3/uL (ref 150.0–400.0)
RBC: 4.86 Mil/uL (ref 4.22–5.81)
RDW: 12.9 % (ref 11.5–15.5)
WBC: 9.9 10*3/uL (ref 4.0–10.5)

## 2023-04-08 LAB — TSH: TSH: 1.03 u[IU]/mL (ref 0.35–5.50)

## 2023-04-08 LAB — VITAMIN B12: Vitamin B-12: 566 pg/mL (ref 211–911)

## 2023-04-12 LAB — VITAMIN B1: Vitamin B1 (Thiamine): 10 nmol/L (ref 8–30)

## 2023-04-20 ENCOUNTER — Other Ambulatory Visit (HOSPITAL_BASED_OUTPATIENT_CLINIC_OR_DEPARTMENT_OTHER): Payer: Self-pay

## 2023-04-20 MED ORDER — METFORMIN HCL 500 MG PO TABS
500.0000 mg | ORAL_TABLET | Freq: Two times a day (BID) | ORAL | 0 refills | Status: DC
  Filled 2023-04-20: qty 30, 15d supply, fill #0

## 2023-04-21 ENCOUNTER — Other Ambulatory Visit (HOSPITAL_BASED_OUTPATIENT_CLINIC_OR_DEPARTMENT_OTHER): Payer: Self-pay

## 2023-04-21 MED ORDER — METFORMIN HCL 500 MG PO TABS: 500.0000 mg | ORAL_TABLET | Freq: Two times a day (BID) | ORAL | 0 refills | Status: DC

## 2023-04-21 NOTE — Addendum Note (Signed)
Addended by: Maximino Sarin on: 04/21/2023 09:49 AM   Modules accepted: Orders

## 2023-05-05 ENCOUNTER — Ambulatory Visit: Payer: Medicaid Other | Admitting: Medical

## 2023-05-05 VITALS — BP 116/60 | HR 78 | Resp 18 | Ht 72.0 in | Wt 314.0 lb

## 2023-05-05 DIAGNOSIS — R5383 Other fatigue: Secondary | ICD-10-CM

## 2023-05-05 DIAGNOSIS — Z6841 Body Mass Index (BMI) 40.0 and over, adult: Secondary | ICD-10-CM | POA: Diagnosis not present

## 2023-05-05 DIAGNOSIS — E669 Obesity, unspecified: Secondary | ICD-10-CM

## 2023-05-05 MED ORDER — SEMAGLUTIDE-WEIGHT MANAGEMENT 0.25 MG/0.5ML ~~LOC~~ SOAJ: 0.2500 mg | SUBCUTANEOUS | 0 refills | Status: DC

## 2023-05-05 NOTE — Progress Notes (Signed)
Subjective:    Patient ID: Jon Sanders, male    DOB: 04-03-1981, 42 y.o.   MRN: [id number]  HPI Discussed the use of AI scribe software for clinical note transcription with the patient, who gave verbal consent to proceed.  History of Present Illness   The patient, with a history of obesity, has been actively trying to lose weight. He has made significant lifestyle changes, including reducing his food intake to one meal a day and incorporating protein shakes into his diet. He has seen some success with this regimen, reporting a weight loss from 333 lbs to 314 lbs.  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 He has been taking metformin, which he believes may be contributing to his tiredness. The fatigue is most pronounced at the end of his shift and after taking his second dose of metformin.  The patient has a family history of hypothyroidism but denies any personal history of pancreatitis or fh thyroid cancer. He has expressed interest in trying Wegovy to aid in his weight loss efforts.        No past medical history on file.   Social History   Socioeconomic History   Marital status: Married    Spouse name: Not on file   Number of children: Not on file   Years of education: Not on file   Highest education level: Not on file  Occupational History   Not on file  Tobacco Use   Smoking status: Every Day    Current packs/day: 1.00    Average packs/day: 1 pack/day for 20.0 years (20.0 ttl pk-yrs)    Types: Cigarettes   Smokeless tobacco: Never  Vaping Use   Vaping status: Never Used  Substance and Sexual Activity   Alcohol use: No    Alcohol/week: 0.0 standard drinks of alcohol   Drug use: No   Sexual activity: Yes  Other Topics Concern   Not on file  Social History Narrative   Not on file   Social Determinants of Health   Financial Resource Strain: Not on file  Food Insecurity: Not on file   Transportation Needs: Not on file  Physical Activity: Not on file  Stress: Not on file  Social Connections: Unknown (09/26/2021)   Received from [facility]   Social Network    Social Network: Not on file  Intimate Partner Violence: Unknown (08/25/2021)   Received from [facility]   HITS    Physically Hurt: Not on file    Insult or Talk Down To: Not on file    Threaten Physical Harm: Not on file    Scream or Curse: Not on file    Past Surgical History:  Procedure Laterality Date   KNEE SURGERY     Left    Family History  Problem Relation Age of Onset   Heart disease Mother     No Known Allergies  Current Outpatient Medications on File Prior to Visit  Medication Sig Dispense Refill   metFORMIN (GLUCOPHAGE) 500 MG tablet Take 1 tablet (500 mg total) by mouth 2 (two) times daily with a meal. 30 tablet 0   omeprazole (PRILOSEC) 20 MG capsule Take 1 capsule (20 mg total) by mouth daily. 30 capsule 3   No current facility-administered medications on file prior to visit.    BP 116/60   Pulse 78   Resp 18   Ht 6' (1.829 m)   Wt (!) 314 lb (  142.4 kg)   SpO2 90%   BMI 42.59 kg/m     Review of Systems See hpi     Objective:   Physical Exam  General Mental Status- Alert. General Appearance- Not in acute distress.    Neck Carotid Arteries- Normal color. Moisture- Normal Moisture. No carotid bruits. No JVD.  Chest and Lung Exam Auscultation: Breath Sounds:-Normal.  Cardiovascular Auscultation:Rythm- Regular. Murmurs & Other Heart Sounds:Auscultation of the heart reveals- No Murmurs.    Neurologic Cranial Nerve exam:- CN III-XII intact(No nystagmus), symmetric smile. Strength:- 5/5 equal and symmetric strength both upper and lower extremities.       Assessment & Plan:   Assessment and Plan    Obesity Significant weight loss (19 lbs) with lifestyle modifications including diet and exercise. 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 No symptoms of hypoglycemia reported. BMI decreased from 45 to 42. -Continue current lifestyle modifications. -Consider adding healthy snacks every 5 hours during work shift. -Obtain glucometer and check blood glucose if feeling lightheaded or weak at work. -Consider Wegovy 0.25 mg injection weekly, pending insurance approval.  Fatigue, particularly towards the end of 10 hour work shifts(describes hard work). No symptoms of hypoglycemia reported.(labs in november did not reveal cause for fatigue.  -Continue Metformin. -Check blood glucose if feeling lightheaded or weak at work. -If wegovy covered then metformin.  General Health Maintenance -Continue monitoring weight and BMI. -Check blood glucose if feeling lightheaded or weak at work.   Follow up date to be determined/depending on wegovy start date.       Esperanza Richters, PA-C

## 2023-05-05 NOTE — Patient Instructions (Signed)
Obesity Significant weight loss (19 lbs) with lifestyle modifications including diet and exercise. 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 No symptoms of hypoglycemia reported. BMI decreased from 45 to 42. -Continue current lifestyle modifications. -Consider adding healthy snacks every 5 hours during work shift. -Obtain glucometer and check blood glucose if feeling lightheaded or weak at work. -Consider Wegovy 0.25 mg injection weekly, pending insurance approval.  Fatigue, particularly towards the end of 10 hour work shifts(describes hard work). No symptoms of hypoglycemia reported.(labs in november did not reveal cause for fatigue.  -Continue Metformin. -Check blood glucose if feeling lightheaded or weak at work. -If wegovy covered then metformin.  General Health Maintenance -Continue monitoring weight and BMI. -Check blood glucose if feeling lightheaded or weak at work.   Follow up date to be determined/depending on wegovy start date.

## 2023-05-06 ENCOUNTER — Other Ambulatory Visit (HOSPITAL_BASED_OUTPATIENT_CLINIC_OR_DEPARTMENT_OTHER): Payer: Self-pay

## 2023-05-06 ENCOUNTER — Telehealth: Payer: Self-pay

## 2023-05-06 MED ORDER — WEGOVY 0.25 MG/0.5ML ~~LOC~~ SOAJ
0.2500 mg | SUBCUTANEOUS | 0 refills | Status: DC
  Filled 2023-05-06: qty 2, 28d supply, fill #0

## 2023-05-06 NOTE — Telephone Encounter (Signed)
PA initiated via Covermymeds; KEY:  [id number]. Awaiting determination.

## 2023-05-06 NOTE — Telephone Encounter (Signed)
Approved 05/06/2023 - 11/02/2023

## 2023-05-28 ENCOUNTER — Other Ambulatory Visit (HOSPITAL_BASED_OUTPATIENT_CLINIC_OR_DEPARTMENT_OTHER): Payer: Self-pay

## 2023-05-28 ENCOUNTER — Ambulatory Visit: Payer: Medicaid Other | Admitting: Medical

## 2023-05-28 ENCOUNTER — Encounter: Payer: Self-pay | Admitting: Medical

## 2023-05-28 ENCOUNTER — Other Ambulatory Visit (HOSPITAL_COMMUNITY): Payer: Self-pay

## 2023-05-28 VITALS — BP 110/70 | HR 93 | Temp 98.0°F | Resp 18 | Ht 72.0 in | Wt 311.2 lb

## 2023-05-28 DIAGNOSIS — E669 Obesity, unspecified: Secondary | ICD-10-CM | POA: Diagnosis not present

## 2023-05-28 DIAGNOSIS — Z125 Encounter for screening for malignant neoplasm of prostate: Secondary | ICD-10-CM | POA: Diagnosis not present

## 2023-05-28 DIAGNOSIS — K5900 Constipation, unspecified: Secondary | ICD-10-CM

## 2023-05-28 DIAGNOSIS — Z1211 Encounter for screening for malignant neoplasm of colon: Secondary | ICD-10-CM

## 2023-05-28 MED ORDER — WEGOVY 0.5 MG/0.5ML ~~LOC~~ SOAJ
0.5000 mg | SUBCUTANEOUS | 0 refills | Status: DC
  Filled 2023-05-28 (×2): qty 2, 28d supply, fill #0

## 2023-05-28 NOTE — Progress Notes (Signed)
 Subjective:    Patient ID: Jon Sanders, male    DOB: 1980-06-03, 43 y.o.   MRN: [id number]  HPI  Discussed the use of AI scribe software for clinical note transcription with the patient, who gave verbal consent to proceed.  History of Present Illness   The patient, with a history of weight management issues and recently started on Wegovy , presents with concerns of decreased bowel movements. They report a frequency of approximately once every four days, which may be to a low intake of food. The stool consistency was not specified, but the patient did note that they had taken Exlax, which seemed to 'break everything up' after a day.  The patient has made significant lifestyle changes, including increased physical activity and dietary modifications. They have incorporated protein shakes, protein bars, salads, and grilled chicken into their diet. Despite these changes, the patient has continued to experience irregular bowel movements, which they find concerning.  The patient has also been taking Metformin , but it was discontinued prior to starting Wegovy . They have noticed an increase in their fluid intake since starting the medication, primarily consuming flavored water.  The patient is due to turn 43 soon and is aware of the need for a screening colonoscopy. Despite these measures, the patient has not seen significant improvement in their bowel movements.       Review of Systems  Constitutional:  Negative for chills, fatigue and fever.  HENT:  Negative for congestion and drooling.   Respiratory:  Negative for cough, chest tightness and wheezing.   Cardiovascular:  Negative for chest pain and palpitations.  Gastrointestinal:  Positive for constipation. Negative for abdominal pain, blood in stool and nausea.       See hpi  Genitourinary:  Negative for dysuria, flank pain and hematuria.  Musculoskeletal:  Negative for back pain, joint swelling, neck pain and neck stiffness.  Skin:   Negative for rash.  Neurological:  Negative for dizziness, speech difficulty and light-headedness.  Hematological:  Negative for adenopathy. Does not bruise/bleed easily.  Psychiatric/Behavioral:  Negative for behavioral problems and confusion. The patient is not nervous/anxious.     No past medical history on file.   Social History   Socioeconomic History   Marital status: Married    Spouse name: Not on file   Number of children: Not on file   Years of education: Not on file   Highest education level: Not on file  Occupational History   Not on file  Tobacco Use   Smoking status: Every Day    Current packs/day: 1.00    Average packs/day: 1 pack/day for 20.0 years (20.0 ttl pk-yrs)    Types: Cigarettes   Smokeless tobacco: Never  Vaping Use   Vaping status: Never Used  Substance and Sexual Activity   Alcohol use: No    Alcohol/week: 0.0 standard drinks of alcohol   Drug use: No   Sexual activity: Yes  Other Topics Concern   Not on file  Social History Narrative   Not on file   Social Drivers of Health   Financial Resource Strain: Not on file  Food Insecurity: Not on file  Transportation Needs: Not on file  Physical Activity: Not on file  Stress: Not on file  Social Connections: Unknown (09/26/2021)   Received [facility]   Social Network    Social Network: Not on file  Intimate Partner Violence: Unknown (08/25/2021)   Received from [facility]   HITS    Physically Hurt:  Not on file    Insult or Talk Down To: Not on file    Threaten Physical Harm: Not on file    Scream or Curse: Not on file    Past Surgical History:  Procedure Laterality Date   KNEE SURGERY     Left    Family History  Problem Relation Age of Onset   Heart disease Mother     No Known Allergies  Current Outpatient Medications on File Prior to Visit  Medication Sig Dispense Refill   metFORMIN  (GLUCOPHAGE ) 500 MG tablet Take 1 tablet (500 mg total) by mouth 2 (two) times daily  with a meal. 30 tablet 0   omeprazole  (PRILOSEC) 20 MG capsule Take 1 capsule (20 mg total) by mouth daily. 30 capsule 3   No current facility-administered medications on file prior to visit.    BP 110/70   Pulse 93   Temp 98 F (36.7 C)   Resp 18   Ht 6' (1.829 m)   Wt (!) 311 lb 3.2 oz (141.2 kg)   SpO2 97%   BMI 42.21 kg/m        Objective:   Physical Exam  General Mental Status- Alert. General Appearance- Not in acute distress.   Skin General: Color- Normal Color. Moisture- Normal Moisture.  Neck Carotid Arteries- Normal color. Moisture- Normal Moisture. No carotid bruits. No JVD.  Chest and Lung Exam Auscultation: Breath Sounds:-Normal.  Cardiovascular Auscultation:Rythm- Regular. Murmurs & Other Heart Sounds:Auscultation of the heart reveals- No Murmurs.  Abdomen Inspection:-Inspeection Normal. Palpation/Percussion:Note:No mass. Palpation and Percussion of the abdomen reveal- Non Tender, Non Distended + BS, no rebound or guarding.   Neurologic Cranial Nerve exam:- CN III-XII intact(No nystagmus), symmetric smile. Strength:- 5/5 equal and symmetric strength both upper and lower extremities.       Assessment & Plan:   Assessment and Plan    Constipation Infrequent bowel movements (every 4 days) with some hard stools. Patient has been using Exlax with some relief. Discussed the importance of hydration, fiber intake, and physical activity. Patient is also on Wegovy  which can contribute to constipation. -Increase fiber intake and hydration. -Continue physical activity. -If no bowel movement after 3 days, use 2 tablespoons of milk of magnesia, 4 ounces of prune juice, and 1 Dulcolax suppository. -If no improvement, consider abdominal x-ray to assess stool volume. -Update in 3.5 weeks or sooner if no bowel movement for more than 3 days or if nausea/vomiting occurs.  Weight Management Patient is on Wegovy  0.25mg  and has noticed significant improvement in  weight and overall well-being. No nausea or vomiting reported. -Increase Wegovy  to next dosage. -Continue current diet and exercise regimen. -Update in 3.5 weeks or sooner if adverse effects occur.  Colon Cancer Screening Patient is turning 43 soon and is due for a screening colonoscopy. -Order colonoscopy when patient turns 43.   Follow up date to be determined after 3.5 week update or sooner if needed.

## 2023-05-28 NOTE — Patient Instructions (Signed)
 Constipation Infrequent bowel movements (every 4 days) with some hard stools. Patient has been using Exlax with some relief. Discussed the importance of hydration, fiber intake, and physical activity. Patient is also on Wegovy  which can contribute to constipation. -Increase fiber intake and hydration. -Continue physical activity. -If no bowel movement after 3 days, use 2 tablespoons of milk of magnesia, 4 ounces of prune juice, and 1 Dulcolax suppository. -If no improvement, consider abdominal x-ray to assess stool volume. -Update in 3.5 weeks or sooner if no bowel movement for more than 3 days or if nausea/vomiting occurs.  Weight Management Patient is on Wegovy  0.25mg  and has noticed significant improvement in weight and overall well-being. No nausea or vomiting reported. -Increase Wegovy  to next dosage. -Continue current diet and exercise regimen. -Update in 3.5 weeks or sooner if adverse effects occur.  Colon Cancer Screening Patient is turning 43 soon and is due for a screening colonoscopy. -Order colonoscopy when patient turns 43.   Follow up date to be determined after 3.5 week update or sooner if needed.

## 2023-05-31 NOTE — Addendum Note (Signed)
 Addended by: Gwenevere Abbot on: 05/31/2023 07:46 AM   Modules accepted: Orders

## 2023-06-25 NOTE — Addendum Note (Signed)
Addended by: Gwenevere Abbot on: 06/25/2023 03:32 PM   Modules accepted: Orders

## 2023-06-29 ENCOUNTER — Ambulatory Visit: Payer: Medicaid Other | Admitting: Medical

## 2023-06-29 ENCOUNTER — Telehealth: Payer: Self-pay

## 2023-06-29 NOTE — Telephone Encounter (Signed)
 Copied from CRM [id number]. 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

## 2023-06-30 NOTE — Telephone Encounter (Signed)
 Pt has mychart video visit scheduled for 07/01/23.

## 2023-07-01 ENCOUNTER — Telehealth: Payer: Medicaid Other | Admitting: Medical

## 2023-07-01 ENCOUNTER — Encounter: Payer: Self-pay | Admitting: Medical

## 2023-07-01 VITALS — Wt 306.0 lb

## 2023-07-01 DIAGNOSIS — R110 Nausea: Secondary | ICD-10-CM | POA: Diagnosis not present

## 2023-07-01 DIAGNOSIS — E669 Obesity, unspecified: Secondary | ICD-10-CM | POA: Diagnosis not present

## 2023-07-01 NOTE — Progress Notes (Signed)
   Subjective:    Patient ID: Jon Sanders, male    DOB: Nov 15, 1980, 43 y.o.   MRN: [id number]  Discussed the use of AI scribe software for clinical note transcription with the patient, who gave verbal consent to proceed.  Virtual Visit via Video Note  I connected with Jon Sanders on 07/01/23 at  8:20 AM EST by a video enabled telemedicine application and verified that I am speaking with the correct person using two identifiers.  Location: Patient: in [place], [place]. Provider: office   I discussed the limitations of evaluation and management by telemedicine and the availability of in person appointments. The patient expressed understanding and agreed to proceed.   History of Present Illness   Jon Sanders is a 43 year old male who presents with weight management concerns while on Wegovy  therapy.  He has been on Wegovy  therapy for four weeks, administering a 0.5 mg dose every Tuesday. He missed his last injection due to work commitments in [place] and is currently two days late for his fourth injection. His weight has plateaued, fluctuating between 303 and 306 pounds, which he attributes to the missed dose. Admits eating more since his birthday.  He experiences mild gastrointestinal symptoms, including transient stomach pain and nausea, approximately two days after an injection. These symptoms are mild, resolve quickly, and are often alleviated by consuming a protein shake or snack. No vomiting has occurred.  His exercise routine is mild, consisting of walking, crunches, and stretches. He acknowledges a lapse in healthy eating for about a week due to his birthday but is otherwise trying to maintain a healthy diet.  No significant side effects from Wegovy , such as nausea, vomiting, or stomach pain, except for a brief episode of stomach pain and nausea two weeks ago.       Observations/Objective: General-no acute distress, pleasant, oriented. Lungs- on inspection lungs appear  unlabored. Neck- no tracheal deviation or jvd on inspection. Neuro- gross motor function appears intact.   Assessment and Plan: Assessment & Plan:  Assessment and Plan    Obesity Weight plateau at 303-306 lbs. Missed last dose of Wegovy  0.5mg  due to being away from home. Mild GI symptoms reported two weeks ago, possibly related to Wegovy . -Resume Wegovy  0.5mg  as soon as possible, ideally before next Tuesday. -Order lipase and metabolic panel to be done on 07/02/2023 to check lipase level.. -If labs are normal and no adverse effects, increase Wegovy  to 1mg  for next dose. -Encourage consistent exercise and healthy eating habits.  General Health Maintenance -Continue monitoring blood pressure and pulse regularly.   Follow up to be determined after lab review and pt update.   Follow Up Instructions:    I discussed the assessment and treatment plan with the patient. The patient was provided an opportunity to ask questions and all were answered. The patient agreed with the plan and demonstrated an understanding of the instructions.   The patient was advised to call back or seek an in-person evaluation if the symptoms worsen or if the condition fails to improve as anticipated.     Timiko Offutt, PA-C

## 2023-07-01 NOTE — Patient Instructions (Signed)
 Obesity Weight plateau at 303-306 lbs. Missed last dose of Wegovy  0.5mg  due to being away from home. Mild GI symptoms reported two weeks ago, possibly related to Wegovy . -Resume Wegovy  0.5mg  as soon as possible, ideally before next Tuesday. -Order lipase and metabolic panel to be done on 07/02/2023 to check lipase level.. -If labs are normal and no adverse effects, increase Wegovy  to 1mg  for next dose. -Encourage consistent exercise and healthy eating habits.  General Health Maintenance -Continue monitoring blood pressure and pulse regularly.   Follow up to be determined after lab review and pt update.

## 2023-07-03 MED ORDER — WEGOVY 1 MG/0.5ML ~~LOC~~ SOAJ: SUBCUTANEOUS | 0 refills | Status: DC

## 2023-07-03 NOTE — Addendum Note (Signed)
 Addended by: Serafina Damme on: 07/03/2023 05:47 PM   Modules accepted: Orders

## 2023-07-07 ENCOUNTER — Encounter: Payer: Self-pay | Admitting: Gastroenterology

## 2023-07-09 ENCOUNTER — Ambulatory Visit: Payer: Medicaid Other | Admitting: Medical

## 2023-07-09 ENCOUNTER — Encounter: Payer: Self-pay | Admitting: Medical

## 2023-07-09 ENCOUNTER — Other Ambulatory Visit (HOSPITAL_BASED_OUTPATIENT_CLINIC_OR_DEPARTMENT_OTHER): Payer: Self-pay

## 2023-07-09 VITALS — BP 122/70 | HR 89 | Temp 98.0°F | Resp 18 | Ht 72.0 in | Wt 309.0 lb

## 2023-07-09 DIAGNOSIS — R110 Nausea: Secondary | ICD-10-CM | POA: Diagnosis not present

## 2023-07-09 DIAGNOSIS — E669 Obesity, unspecified: Secondary | ICD-10-CM | POA: Diagnosis not present

## 2023-07-09 DIAGNOSIS — Z125 Encounter for screening for malignant neoplasm of prostate: Secondary | ICD-10-CM

## 2023-07-09 LAB — LIPASE: Lipase: 18 U/L (ref 7–60)

## 2023-07-09 MED ORDER — WEGOVY 1 MG/0.5ML ~~LOC~~ SOAJ
1.0000 mg | SUBCUTANEOUS | 0 refills | Status: DC
  Filled 2023-07-09: qty 2, 28d supply, fill #0

## 2023-07-09 NOTE — Addendum Note (Signed)
Addended by: Maximino Sarin on: 07/09/2023 03:25 PM   Modules accepted: Orders

## 2023-07-09 NOTE — Progress Notes (Signed)
Subjective:    Patient ID: Jon Sanders, male    DOB: July 14, 1980, 43 y.o.   MRN: [id number]  HPI  Discussed the use of AI scribe software for clinical note transcription with the patient, who gave verbal consent to proceed.  History of Present Illness   Jon Sanders is a 43 year old male who presents for weight management follow-up.  After celebrating his birthday, he gained some weight but has since lost three pounds, bringing his current weight to 303 pounds. He attributes the weight gain to overindulgence in 'standard birthday food.'  He is currently on Wegovy, with his last dose of 0.5 mg taken last Friday. He is due for his next dose today. Total weight loss so far 55 lb.  He experienced brief nausea around his birthday, which he associates with overeating, but denies any vomiting. The nausea was transient and resolved quickly.  His wife has set up two appointments with a gastroenterologist, likely in preparation for a colonoscopy, which is recommended starting at age 30.          Review of Systems  Constitutional:  Negative for chills, fatigue and fever.  Respiratory:  Negative for cough, chest tightness and wheezing.   Cardiovascular:  Negative for chest pain and palpitations.  Gastrointestinal:  Negative for abdominal pain, diarrhea and vomiting.  Genitourinary:  Negative for dysuria and frequency.  Musculoskeletal:  Negative for back pain.  Skin:  Negative for rash.  Neurological:  Negative for dizziness and headaches.  Hematological:  Negative for adenopathy. Does not bruise/bleed easily.  Psychiatric/Behavioral:  Negative for behavioral problems and decreased concentration.     No past medical history on file.   Social History   Socioeconomic History   Marital status: Married    Spouse name: Not on file   Number of children: Not on file   Years of education: Not on file   Highest education level: Not on file  Occupational History   Not on file   Tobacco Use   Smoking status: Every Day    Current packs/day: 1.00    Average packs/day: 1 pack/day for 20.0 years (20.0 ttl pk-yrs)    Types: Cigarettes   Smokeless tobacco: Never  Vaping Use   Vaping status: Never Used  Substance and Sexual Activity   Alcohol use: No    Alcohol/week: 0.0 standard drinks of alcohol   Drug use: No   Sexual activity: Yes  Other Topics Concern   Not on file  Social History Narrative   Not on file   Social Drivers of Health   Financial Resource Strain: Not on file  Food Insecurity: Not on file  Transportation Needs: Not on file  Physical Activity: Not on file  Stress: Not on file  Social Connections: Unknown (09/26/2021)   [facility]   Social Network    Social Network: Not on file  Intimate Partner Violence: Unknown (08/25/2021)   Received from [facility]   HITS    Physically Hurt: Not on file    Insult or Talk Down To: Not on file    Threaten Physical Harm: Not on file    Scream or Curse: Not on file    Past Surgical History:  Procedure Laterality Date   KNEE SURGERY     Left    Family History  Problem Relation Age of Onset   Heart disease Mother     No Known Allergies  Current Outpatient Medications on File Prior to Visit  Medication Sig Dispense Refill   metFORMIN (GLUCOPHAGE) 500 MG tablet Take 1 tablet (500 mg total) by mouth 2 (two) times daily with a meal. 30 tablet 0   omeprazole (PRILOSEC) 20 MG capsule Take 1 capsule (20 mg total) by mouth daily. 30 capsule 3   Semaglutide-Weight Management (WEGOVY) 1 MG/0.5ML SOAJ 1 mg weekly injection 2 mL 0   No current facility-administered medications on file prior to visit.    BP 122/70   Pulse 89   Temp 98 F (36.7 C)   Resp 18   Ht 6' (1.829 m)   Wt (!) 309 lb (140.2 kg)   SpO2 96%   BMI 41.91 kg/m        Objective:   Physical Exam   General Mental Status- Alert. General Appearance- Not in acute distress.   Skin General: Color- Normal  Color. Moisture- Normal Moisture.  Neck No JVD.  Chest and Lung Exam Auscultation: Breath Sounds:-Normal.  Cardiovascular Auscultation:Rythm- Regular. Murmurs & Other Heart Sounds:Auscultation of the heart reveals- No Murmurs.  Abdomen Inspection:-Inspeection Normal. Palpation/Percussion:Note:No mass. Palpation and Percussion of the abdomen reveal- Non Tender, Non Distended + BS, no rebound or guarding.  Neurologic Cranial Nerve exam:- CN III-XII intact(No nystagmus), symmetric smile. Strength:- 5/5 equal and symmetric strength both upper and lower extremities.      Assessment & Plan:   Patient Instructions  Obesity Recent weight loss of 3 pounds after a brief period of weight gain associated with birthday celebrations. Currently on Wegovy 0.5mg  with transient nausea likely related to overeating around birthday. -Continue Wegovy 0.5mg  weekly. -Obtain metabolic panel and lipase to monitor for potential side effects of medication. -Encouraged to maintain a healthy diet and regular exercise. -Update weight status in 3-4 weeks.  Colon Cancer Screening Referral to GI for colonoscopy has been made. -Continue with scheduled appointments with GI specialist.  Prostate Cancer Screening No prior PSA screening noted. -Obtain screening PSA with current labs.  Follow-up To be determined based on weight update in 3-4 weeks.   Esperanza Richters, PA-C

## 2023-07-09 NOTE — Addendum Note (Signed)
Addended by: Gwenevere Abbot on: 07/09/2023 03:18 PM   Modules accepted: Orders

## 2023-07-09 NOTE — Patient Instructions (Signed)
Obesity Recent weight loss of 3 pounds after a brief period of weight gain associated with birthday celebrations. Currently on Wegovy 0.5mg  with transient nausea likely related to overeating around birthday. -Continue Wegovy 0.5mg  weekly. -Obtain metabolic panel and lipase to monitor for potential side effects of medication. -Encouraged to maintain a healthy diet and regular exercise. -Update weight status in 3-4 weeks.  Colon Cancer Screening Referral to GI for colonoscopy has been made. -Continue with scheduled appointments with GI specialist.  Prostate Cancer Screening No prior PSA screening noted. -Obtain screening PSA with current labs.  Follow-up To be determined based on weight update in 3-4 weeks.

## 2023-07-10 LAB — COMPREHENSIVE METABOLIC PANEL
AG Ratio: 1.6 (calc) (ref 1.0–2.5)
ALT: 16 U/L (ref 9–46)
AST: 15 U/L (ref 10–40)
Albumin: 4.3 g/dL (ref 3.6–5.1)
Alkaline phosphatase (APISO): 82 U/L (ref 36–130)
BUN: 14 mg/dL (ref 7–25)
CO2: 26 mmol/L (ref 20–32)
Calcium: 9.5 mg/dL (ref 8.6–10.3)
Chloride: 105 mmol/L (ref 98–110)
Creat: 1.08 mg/dL (ref 0.60–1.29)
Globulin: 2.7 g/dL (ref 1.9–3.7)
Glucose, Bld: 93 mg/dL (ref 65–99)
Potassium: 4.5 mmol/L (ref 3.5–5.3)
Sodium: 139 mmol/L (ref 135–146)
Total Bilirubin: 0.5 mg/dL (ref 0.2–1.2)
Total Protein: 7 g/dL (ref 6.1–8.1)

## 2023-08-05 ENCOUNTER — Ambulatory Visit: Admitting: Medical

## 2023-08-06 ENCOUNTER — Ambulatory Visit: Admitting: Medical

## 2023-08-06 ENCOUNTER — Other Ambulatory Visit (HOSPITAL_BASED_OUTPATIENT_CLINIC_OR_DEPARTMENT_OTHER): Payer: Self-pay

## 2023-08-06 VITALS — BP 110/72 | HR 79 | Resp 18 | Ht 72.0 in | Wt 302.0 lb

## 2023-08-06 DIAGNOSIS — E669 Obesity, unspecified: Secondary | ICD-10-CM | POA: Diagnosis not present

## 2023-08-06 MED ORDER — WEGOVY 1.7 MG/0.75ML ~~LOC~~ SOAJ
1.7000 mg | SUBCUTANEOUS | 3 refills | Status: DC
Start: 2023-08-06 — End: 2023-11-23
  Filled 2023-08-06: qty 3, 28d supply, fill #0
  Filled 2023-08-29: qty 3, 28d supply, fill #1
  Filled 2023-09-23: qty 3, 28d supply, fill #2
  Filled 2023-10-02 (×2): qty 3, 28d supply, fill #0
  Filled 2023-10-22 – 2023-10-27 (×3): qty 3, 28d supply, fill #1

## 2023-08-06 MED ORDER — SEMAGLUTIDE-WEIGHT MANAGEMENT 1.7 MG/0.75ML ~~LOC~~ SOAJ: 1.7000 mg | SUBCUTANEOUS | 0 refills | Status: DC

## 2023-08-06 NOTE — Patient Instructions (Signed)
 Obesity Weight reduced from 309 lbs to 292 lbs. On Wegovy 1 mg weekly with no adverse effects. Following diet and exercise regimen. - Increase Wegovy to 1.7 mg weekly. - Continue current diet and exercise regimen. - Send update in 3.5 weeks via MyChart to assess progress and side effects. - Refill Wegovy prescription if no side effects. - Consider increasing Wegovy to 2 mg if weight loss plateaus.  Colorectal cancer screening 43 years old, no colonoscopy history, family history of polyps. Traditional colonoscopy recommended. - Attend gastroenterology appointment at month's end. - Coordinate with employer for time off. - Provide work note if needed.  Follow up in 4 months or sooner if needed

## 2023-08-06 NOTE — Progress Notes (Signed)
 Subjective:    Patient ID: Jon Sanders, male    DOB: 02-23-1981, 43 y.o.   MRN: [id number]  HPI  Jon Sanders is a 43 year old male who presents for follow-up regarding weight management and medication adjustment.  He has experienced a weight loss from 309 pounds a month ago to 292 pounds yesterday, although his weight today is 302 pounds, which he attributes to wearing heavy clothing(and eating alot yesterday). He celebrated his weight loss with a large burrito. He continues to follow a diet and exercise regimen and is currently on Wegovy, 1 mg weekly, which was recently increased. He took his last dose on Saturday and is due for the next dose tomorrow. No adverse side effects from the medication, such as nausea, vomiting, or stomach pain.  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 He notes a significant improvement in his physical condition, stating he feels 'light as a feather' and experiences no knee pain. 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 He does not currently track his steps due to restrictions with wearing a smartwatch while driving.  His diet includes fruits and vegetables, such as watermelon, cantaloupes, kiwi, strawberries, and grapes, which he consumes during lunch. He avoids processed carbs, red meat, and pork, and includes lean meats like grilled chicken, fish, and [place] in his diet. He has a gym at home but has not yet started weight training.  Regarding family history, there is no known family history of colon cancer, but his father has had a few polyps. He has not had a colonoscopy.     Review of Systems  Constitutional:  Negative for chills, fatigue and fever.  HENT:  Negative for congestion, ear discharge and ear pain.   Respiratory:  Negative for cough, chest tightness, shortness of breath and  wheezing.   Cardiovascular:  Negative for chest pain and palpitations.  Gastrointestinal:  Negative for abdominal pain, blood in stool and nausea.  Genitourinary:  Negative for dysuria, flank pain and genital sores.  Musculoskeletal:  Negative for arthralgias, gait problem and neck pain.  Skin:  Negative for rash.  Neurological:  Negative for dizziness, speech difficulty, numbness and headaches.  Hematological:  Negative for adenopathy. Does not bruise/bleed easily.  Psychiatric/Behavioral:  Negative for behavioral problems, decreased concentration and dysphoric mood.      No past medical history on file.   Social History   Socioeconomic History   Marital status: Married    Spouse name: Not on file   Number of children: Not on file   Years of education: Not on file   Highest education level: GED or equivalent  Occupational History   Not on file  Tobacco Use   Smoking status: Every Day    Current packs/day: 1.00    Average packs/day: 1 pack/day for 20.0 years (20.0 ttl pk-yrs)    Types: Cigarettes   Smokeless tobacco: Never  Vaping Use   Vaping status: Never Used  Substance and Sexual Activity   Alcohol use: No    Alcohol/week: 0.0 standard drinks of alcohol   Drug use: No   Sexual activity: Yes  Other Topics Concern   Not on file  Social History Narrative   Not on file   Social Drivers of Health   Financial Resource Strain: Low Risk  (08/06/2023)   Overall Financial  Resource Strain (CARDIA)    Difficulty of Paying Living Expenses: Not very hard  Food Insecurity: Food Insecurity Present (08/06/2023)   Hunger Vital Sign    Worried About Running Out of Food in the Last Year: Sometimes true    Ran Out of Food in the Last Year: Sometimes true  Transportation Needs: No Transportation Needs (08/06/2023)   PRAPARE - Administrator, Civil Service (Medical): No    Lack of Transportation (Non-Medical): No  Physical Activity: Unknown (08/06/2023)   Exercise Vital  Sign    Days of Exercise per Week: 0 days    Minutes of Exercise per Session: Not on file  Stress: Stress Concern Present (08/06/2023)   [employer] of Occupational Health - Occupational Stress Questionnaire    Feeling of Stress : To some extent  Social Connections: Moderately Isolated (08/06/2023)   Social Connection and Isolation Panel [NHANES]    Frequency of Communication with Friends and Family: More than three times a week    Frequency of Social Gatherings with Friends and Family: Once a week    Attends Religious Services: Never    Database administrator or Organizations: No    Attends Engineer, structural: Not on file    Marital Status: Married  Intimate Partner Violence: Unknown (08/25/2021)   Received from [facility]   HITS    Physically Hurt: Not on file    Insult or Talk Down To: Not on file    Threaten Physical Harm: Not on file    Scream or Curse: Not on file    Past Surgical History:  Procedure Laterality Date   KNEE SURGERY     Left    Family History  Problem Relation Age of Onset   Heart disease Mother     No Known Allergies  Current Outpatient Medications on File Prior to Visit  Medication Sig Dispense Refill   metFORMIN (GLUCOPHAGE) 500 MG tablet Take 1 tablet (500 mg total) by mouth 2 (two) times daily with a meal. 30 tablet 0   omeprazole (PRILOSEC) 20 MG capsule Take 1 capsule (20 mg total) by mouth daily. 30 capsule 3   Semaglutide-Weight Management (WEGOVY) 1 MG/0.5ML SOAJ Inject 1 mg into the skin once a week. 2 mL 0   No current facility-administered medications on file prior to visit.    BP 110/72   Pulse 79   Resp 18   Ht 6' (1.829 m)   Wt (!) 302 lb (137 kg)   SpO2 98%   BMI 40.96 kg/m        Objective:   Physical Exam  General Mental Status- Alert. General Appearance- Not in acute distress.   Skin General: Color- Normal Color. Moisture- Normal Moisture.  Neck Carotid Arteries- Normal color. Moisture- Normal  Moisture. No carotid bruits. No JVD.  Chest and Lung Exam Auscultation: Breath Sounds:-CTA  Cardiovascular Auscultation:Rythm- RRR Murmurs & Other Heart Sounds:Auscultation of the heart reveals- No Murmurs.  Abdomen Inspection:-Inspeection Normal. Palpation/Percussion:Note:No mass. Palpation and Percussion of the abdomen reveal- Non Tender, Non Distended + BS, no rebound or guarding.   Neurologic Cranial Nerve exam:- CN III-XII intact(No nystagmus), symmetric smile. Strength:- 5/5 equal and symmetric strength both upper and lower extremities.       Assessment & Plan:   Patient Instructions  Obesity Weight reduced from 309 lbs to 292 lbs. On Wegovy 1 mg weekly with no adverse effects. Following diet and exercise regimen. - Increase Wegovy to 1.7 mg weekly. -  Continue current diet and exercise regimen. - Send update in 3.5 weeks via MyChart to assess progress and side effects. - Refill Wegovy prescription if no side effects. - Consider increasing Wegovy to 2 mg if weight loss plateaus.  Colorectal cancer screening 43 years old, no colonoscopy history, family history of polyps. Traditional colonoscopy recommended. - Attend gastroenterology appointment at month's end. - Coordinate with employer for time off. - Provide work note if needed.  Follow up in 4 months or sooner if needed

## 2023-08-20 ENCOUNTER — Ambulatory Visit: Payer: Medicaid Other | Admitting: Gastroenterology

## 2023-08-30 ENCOUNTER — Other Ambulatory Visit: Payer: Self-pay

## 2023-08-31 ENCOUNTER — Other Ambulatory Visit: Payer: Self-pay

## 2023-08-31 ENCOUNTER — Other Ambulatory Visit (HOSPITAL_COMMUNITY): Payer: Self-pay

## 2023-09-21 ENCOUNTER — Encounter: Payer: Self-pay | Admitting: Medical

## 2023-09-23 ENCOUNTER — Other Ambulatory Visit: Payer: Self-pay

## 2023-09-24 ENCOUNTER — Encounter: Payer: Self-pay | Admitting: Pharmacist

## 2023-09-24 ENCOUNTER — Other Ambulatory Visit: Payer: Self-pay

## 2023-09-29 ENCOUNTER — Other Ambulatory Visit: Payer: Self-pay

## 2023-10-02 ENCOUNTER — Other Ambulatory Visit (HOSPITAL_BASED_OUTPATIENT_CLINIC_OR_DEPARTMENT_OTHER): Payer: Self-pay

## 2023-10-02 ENCOUNTER — Other Ambulatory Visit (HOSPITAL_COMMUNITY): Payer: Self-pay

## 2023-10-02 ENCOUNTER — Encounter (HOSPITAL_COMMUNITY): Payer: Self-pay

## 2023-10-04 ENCOUNTER — Other Ambulatory Visit (HOSPITAL_COMMUNITY): Payer: Self-pay

## 2023-10-04 ENCOUNTER — Other Ambulatory Visit: Payer: Self-pay

## 2023-10-05 ENCOUNTER — Other Ambulatory Visit (HOSPITAL_COMMUNITY): Payer: Self-pay

## 2023-10-22 ENCOUNTER — Other Ambulatory Visit: Payer: Self-pay

## 2023-10-23 ENCOUNTER — Other Ambulatory Visit (HOSPITAL_COMMUNITY): Payer: Self-pay

## 2023-11-22 ENCOUNTER — Encounter: Payer: Self-pay | Admitting: Medical

## 2023-11-23 ENCOUNTER — Other Ambulatory Visit (HOSPITAL_BASED_OUTPATIENT_CLINIC_OR_DEPARTMENT_OTHER): Payer: Self-pay

## 2023-11-23 MED ORDER — SEMAGLUTIDE-WEIGHT MANAGEMENT 2.4 MG/0.75ML ~~LOC~~ SOAJ
2.4000 mg | SUBCUTANEOUS | 0 refills | Status: DC
  Filled 2023-11-23: qty 3, 28d supply, fill #0

## 2023-11-23 NOTE — Addendum Note (Signed)
 Addended by: DORINA DALLAS HERO on: 11/23/2023 04:42 PM   Modules accepted: Orders

## 2023-11-24 ENCOUNTER — Other Ambulatory Visit (HOSPITAL_BASED_OUTPATIENT_CLINIC_OR_DEPARTMENT_OTHER): Payer: Self-pay

## 2023-11-24 ENCOUNTER — Telehealth: Payer: Self-pay

## 2023-11-24 ENCOUNTER — Other Ambulatory Visit (HOSPITAL_COMMUNITY): Payer: Self-pay

## 2023-11-24 NOTE — Telephone Encounter (Signed)
 Pharmacy Patient Advocate Encounter   Received notification from CoverMyMeds that prior authorization for Wegovy  2.4 is required/requested.   Insurance verification completed.   The patient is insured through [facility] .   Per test claim: PA required; PA submitted to above mentioned insurance via CoverMyMeds Key/confirmation #/EOC [id number] Status is pending

## 2023-11-24 NOTE — Telephone Encounter (Signed)
 Pharmacy Patient Advocate Encounter  Received notification from [facility] that Prior Authorization for Wegovy  2.4MG /0.75ML auto-injectors  has been APPROVED from 11/24/23 to 11/23/24. Ran test claim, Copay is $4. This test claim was processed through [facility]- copay amounts may vary at other pharmacies due to pharmacy/plan contracts, or as the patient moves through the different stages of their insurance plan.   PA #/Case ID/Reference #: [id number]

## 2023-11-30 NOTE — Telephone Encounter (Signed)
 No further action needed at this time.

## 2023-12-14 ENCOUNTER — Encounter: Payer: Self-pay | Admitting: Medical

## 2023-12-15 ENCOUNTER — Encounter: Payer: Self-pay | Admitting: Internal Medicine

## 2023-12-23 ENCOUNTER — Telehealth (HOSPITAL_COMMUNITY): Payer: Self-pay

## 2023-12-23 ENCOUNTER — Other Ambulatory Visit (HOSPITAL_BASED_OUTPATIENT_CLINIC_OR_DEPARTMENT_OTHER): Payer: Self-pay

## 2023-12-23 ENCOUNTER — Encounter (HOSPITAL_COMMUNITY): Payer: Self-pay

## 2023-12-23 ENCOUNTER — Other Ambulatory Visit (HOSPITAL_COMMUNITY): Payer: Self-pay

## 2023-12-23 MED ORDER — SEMAGLUTIDE-WEIGHT MANAGEMENT 2.4 MG/0.75ML ~~LOC~~ SOAJ
2.4000 mg | SUBCUTANEOUS | 3 refills | Status: DC
  Filled 2023-12-23: qty 3, 28d supply, fill #0

## 2023-12-23 MED ORDER — SEMAGLUTIDE-WEIGHT MANAGEMENT 2.4 MG/0.75ML ~~LOC~~ SOAJ
2.4000 mg | SUBCUTANEOUS | 3 refills | Status: DC
  Filled 2023-12-23 (×3): qty 3, 28d supply, fill #0
  Filled 2024-01-21: qty 3, 28d supply, fill #1
  Filled 2024-02-01 – 2024-02-23 (×2): qty 3, 28d supply, fill #2

## 2023-12-23 NOTE — Telephone Encounter (Signed)
 PA request has been Received. New Encounter has been or will be created for follow up. For additional info see Pharmacy Prior Auth telephone encounter from 12/23/23.

## 2023-12-23 NOTE — Addendum Note (Signed)
 Addended by: DORINA DALLAS HERO on: 12/23/2023 06:03 AM   Modules accepted: Orders

## 2023-12-24 ENCOUNTER — Other Ambulatory Visit (HOSPITAL_COMMUNITY): Payer: Self-pay

## 2024-01-06 ENCOUNTER — Ambulatory Visit (AMBULATORY_SURGERY_CENTER)

## 2024-01-06 VITALS — Ht 72.0 in | Wt 285.0 lb

## 2024-01-06 DIAGNOSIS — Z1211 Encounter for screening for malignant neoplasm of colon: Secondary | ICD-10-CM

## 2024-01-06 MED ORDER — PEG 3350-KCL-NA BICARB-NACL 420 G PO SOLR: 4000.0000 mL | Freq: Once | ORAL | 0 refills | Status: AC

## 2024-01-06 NOTE — Progress Notes (Signed)
 No issues known to pt with past sedation with any surgeries or procedures Patient denies ever being told they had issues or difficulty with intubation  No FH of Malignant Hyperthermia Pt is not on diet pills; goes take Wegovy Pt is not on home 02  Pt is not on blood thinners  Pt states issues with chronic constipation; takes laxative to relieve this  No A fib or A flutter Have any cardiac testing pending--no Ambulates independently

## 2024-01-18 ENCOUNTER — Encounter: Payer: Self-pay | Admitting: Medical

## 2024-01-22 ENCOUNTER — Other Ambulatory Visit (HOSPITAL_COMMUNITY): Payer: Self-pay

## 2024-01-25 ENCOUNTER — Other Ambulatory Visit: Payer: Self-pay

## 2024-01-27 ENCOUNTER — Ambulatory Visit (AMBULATORY_SURGERY_CENTER): Admitting: Internal Medicine

## 2024-01-27 ENCOUNTER — Encounter: Payer: Self-pay | Admitting: Internal Medicine

## 2024-01-27 VITALS — BP 143/100 | HR 72 | Temp 98.8°F | Resp 15 | Ht 72.0 in | Wt 285.0 lb

## 2024-01-27 DIAGNOSIS — D124 Benign neoplasm of descending colon: Secondary | ICD-10-CM | POA: Diagnosis not present

## 2024-01-27 DIAGNOSIS — Z1211 Encounter for screening for malignant neoplasm of colon: Secondary | ICD-10-CM

## 2024-01-27 MED ORDER — SODIUM CHLORIDE 0.9 % IV SOLN: 500.0000 mL | Freq: Once | INTRAVENOUS | Status: DC

## 2024-01-27 MED ORDER — FLEET ENEMA RE ENEM
1.0000 | ENEMA | Freq: Once | RECTAL | Status: AC
  Administered 2024-01-27: 1 via RECTAL

## 2024-01-27 NOTE — Progress Notes (Signed)
 HISTORY OF PRESENT ILLNESS:  Jon Sanders is a 43 y.o. male sent directly for routine screening colonoscopy.  No complaints  REVIEW OF SYSTEMS:  All non-GI ROS negative except for  Past Medical History:  Diagnosis Date   GERD (gastroesophageal reflux disease)     Past Surgical History:  Procedure Laterality Date   KNEE SURGERY  2008   Left    Social History Jon Sanders  reports that he has quit smoking. His smoking use included cigarettes. He has a 20 pack-year smoking history. He has never used smokeless tobacco. He reports that he does not drink alcohol and does not use drugs.  family history includes Colon polyps in his father; Heart disease in his mother.  No Known Allergies     PHYSICAL EXAMINATION: Vital signs: BP (!) 156/71   Pulse 76   Temp 98.8 F (37.1 C) (Skin)   Ht 6' (1.829 m)   Wt 285 lb (129.3 kg)   SpO2 97%   BMI 38.65 kg/m  General: Well-developed, well-nourished, no acute distress HEENT: Sclerae are anicteric, conjunctiva pink. Oral mucosa intact Lungs: Clear Heart: Regular Abdomen: soft, nontender, nondistended, no obvious ascites, no peritoneal signs, normal bowel sounds. No organomegaly. Extremities: No edema Psychiatric: alert and oriented x3. Cooperative     ASSESSMENT:  Colon cancer screening   PLAN:  Screening colonoscopy

## 2024-01-27 NOTE — Progress Notes (Signed)
 Sedate, gd SR, tolerated procedure well, VSS, report to RN

## 2024-01-27 NOTE — Op Note (Signed)
  [facility] Patient Name: Jon Sanders Procedure Date: 01/27/2024 11:47 AM MRN: [id number] Endoscopist: Norleen SAILOR. Abran , MD, [id number] Age: 43 Referring MD:  Date of Birth: 1980-12-15 Gender: Male Account #: [id number] Procedure:                Colonoscopy with cold snare polypectomy x 1 Indications:              Screening for colorectal malignant neoplasm Medicines:                Monitored Anesthesia Care Procedure:                Pre-Anesthesia Assessment:                           - Prior to the procedure, a History and Physical                            was performed, and patient medications and                            allergies were reviewed. The patient's tolerance of                            previous anesthesia was also reviewed. The risks                            and benefits of the procedure and the sedation                            options and risks were discussed with the patient.                            All questions were answered, and informed consent                            was obtained. Prior Anticoagulants: The patient has                            taken no anticoagulant or antiplatelet agents. ASA                            Grade Assessment: II - A patient with mild systemic                            disease. After reviewing the risks and benefits,                            the patient was deemed in satisfactory condition to                            undergo the procedure.                           After obtaining informed consent, the colonoscope  was passed under direct vision. Throughout the                            procedure, the patient's blood pressure, pulse, and                            oxygen saturations were monitored continuously. The                            CF [id number] #[id number] was introduced through the anus                            and advanced to the the cecum, identified by                             appendiceal orifice and ileocecal valve. The                            ileocecal valve, appendiceal orifice, and rectum                            were photographed. The quality of the bowel                            preparation was excellent. The colonoscopy was                            performed without difficulty. The patient tolerated                            the procedure well. The bowel preparation used was                            SUPREP via split dose instruction. Scope In: 12:00:10 PM Scope Out: 12:17:34 PM Scope Withdrawal Time: 0 hours 14 minutes 25 seconds  Total Procedure Duration: 0 hours 17 minutes 24 seconds  Findings:                 A 5 mm polyp was found in the descending colon. The                            polyp was removed with a cold snare. Resection and                            retrieval were complete.                           The exam was otherwise without abnormality on                            direct and retroflexion views. Complications:            No immediate complications. Estimated blood loss:  None. Estimated Blood Loss:     Estimated blood loss: none. Impression:               - One 5 mm polyp in the descending colon, removed                            with a cold snare. Resected and retrieved.                           - The examination was otherwise normal on direct                            and retroflexion views. Recommendation:           - Repeat colonoscopy in 7 years for surveillance.                           - Patient has a contact number available for                            emergencies. The signs and symptoms of potential                            delayed complications were discussed with the                            patient. Return to normal activities tomorrow.                            Written discharge instructions were provided to the                            patient.                            - Resume previous diet.                           - Continue present medications.                           - Await pathology results. Norleen SAILOR. Abran, MD 01/27/2024 12:21:56 PM This report has been signed electronically.

## 2024-01-27 NOTE — Patient Instructions (Signed)
 YOU HAD AN ENDOSCOPIC PROCEDURE TODAY AT THE [facility]:   Refer to the procedure report that was given to you for any specific questions about what was found during the examination.  If the procedure report does not answer your questions, please call your gastroenterologist to clarify.  If you requested that your care partner not be given the details of your procedure findings, then the procedure report has been included in a sealed envelope for you to review at your convenience later.  YOU SHOULD EXPECT: Some feelings of bloating in the abdomen. Passage of more gas than usual.  Walking can help get rid of the air that was put into your GI tract during the procedure and reduce the bloating. If you had a lower endoscopy (such as a colonoscopy or flexible sigmoidoscopy) you may notice spotting of blood in your stool or on the toilet paper. If you underwent a bowel prep for your procedure, you may not have a normal bowel movement for a few days.  Please Note:  You might notice some irritation and congestion in your nose or some drainage.  This is from the oxygen used during your procedure.  There is no need for concern and it should clear up in a day or so.  SYMPTOMS TO REPORT IMMEDIATELY:  Following lower endoscopy (colonoscopy or flexible sigmoidoscopy):  Excessive amounts of blood in the stool  Significant tenderness or worsening of abdominal pains  Swelling of the abdomen that is new, acute  Fever of 100F or higher  Resume previous diet Continue present medications Await pathology results Repeat colonoscopy in 7 years  For urgent or emergent issues, a gastroenterologist can be reached at any hour by calling [phone]. Do not use MyChart messaging for urgent concerns.    DIET:  We do recommend a small meal at first, but then you may proceed to your regular diet.  Drink plenty of fluids but you should avoid alcoholic beverages for 24 hours.  ACTIVITY:  You should plan to  take it easy for the rest of today and you should NOT DRIVE or use heavy machinery until tomorrow (because of the sedation medicines used during the test).    FOLLOW UP: Our staff will call the number listed on your records the next business day following your procedure.  We will call around 7:15- 8:00 am to check on you and address any questions or concerns that you may have regarding the information given to you following your procedure. If we do not reach you, we will leave a message.     If any biopsies were taken you will be contacted by phone or by letter within the next 1-3 weeks.  Please call us  at [phone] if you have not heard about the biopsies in 3 weeks.    SIGNATURES/CONFIDENTIALITY: You and/or your care partner have signed paperwork which will be entered into your electronic medical record.  These signatures attest to the fact that that the information above on your After Visit Summary has been reviewed and is understood.  Full responsibility of the confidentiality of this discharge information lies with you and/or your care-partner.

## 2024-01-28 ENCOUNTER — Other Ambulatory Visit (HOSPITAL_BASED_OUTPATIENT_CLINIC_OR_DEPARTMENT_OTHER): Payer: Self-pay

## 2024-01-28 ENCOUNTER — Telehealth: Payer: Self-pay | Admitting: *Deleted

## 2024-01-28 ENCOUNTER — Telehealth (INDEPENDENT_AMBULATORY_CARE_PROVIDER_SITE_OTHER): Admitting: Medical

## 2024-01-28 ENCOUNTER — Encounter: Payer: Self-pay | Admitting: Medical

## 2024-01-28 VITALS — BP 106/81 | HR 77

## 2024-01-28 DIAGNOSIS — E669 Obesity, unspecified: Secondary | ICD-10-CM | POA: Diagnosis not present

## 2024-01-28 DIAGNOSIS — R5383 Other fatigue: Secondary | ICD-10-CM

## 2024-01-28 DIAGNOSIS — R202 Paresthesia of skin: Secondary | ICD-10-CM

## 2024-01-28 DIAGNOSIS — R200 Anesthesia of skin: Secondary | ICD-10-CM

## 2024-01-28 DIAGNOSIS — R42 Dizziness and giddiness: Secondary | ICD-10-CM

## 2024-01-28 MED ORDER — WEGOVY 2.4 MG/0.75ML ~~LOC~~ SOAJ
2.4000 mg | SUBCUTANEOUS | 0 refills | Status: DC
  Filled 2024-01-28 – 2024-02-01 (×2): qty 3, 28d supply, fill #0

## 2024-01-28 NOTE — Progress Notes (Signed)
 Virtual Visit via Video Note  I connected with [name] on 01/28/24 at  8:40 AM EDT by a video enabled telemedicine application and verified that I am speaking with the correct person using two identifiers.  Location: Patient: home [place] Provider: office [place]   I discussed the limitations of evaluation and management by telemedicine and the availability of in person appointments. The patient expressed understanding and agreed to proceed.  History of Present Illness: Discussed the use of AI scribe software for clinical note transcription with the patient, who gave verbal consent to proceed.  History of Present Illness          JUVENTINO PAVONE is a 43 year old male who presents for a potential medication change due to insurance coverage issues. He is accompanied by his wife.  He is currently on Wegovy  2.4 mg weekly injections for obesity management. His last recorded weight was 283 pounds two days ago. He experiences occasional nausea after injections, which resolves quickly without vomiting or significant stomach pain. He has been more active, engaging in walking and light exercise, and has recently lost an additional three to four pounds.  He recently underwent a colonoscopy, during which his blood pressure readings varied, with some readings being high, normal, and low. He has not provided an updated weight or blood pressure reading for today.  His new insurance, [employer] possibly does not cover Wegovy  2.4 mg without preauthorization. He has a month's supply of Wegovy  pens remaining. His previous insurance, Medicaid, covered the medication with a $4 copay, but he is transitioning to his wife's insurance due to her full-time employment.  He  also reports intermittent dizziness and lightheadedness. He also experiences occasional motor function issues in his right hand, describing it as feeling 'numb' and 'slow' when trying to release objects. These symptoms last about  20 minutes and improve with movement. No current neurologic or motor symptoms on video visit.  He takes vitamin B12 1000 mcg but still feels fatigued and 'worn down' quickly. His B12 levels were previously normal.       Review of Systems  Constitutional:  Positive for fatigue. Negative for chills.  Respiratory:  Negative for choking, shortness of breath and wheezing.   Cardiovascular:  Negative for chest pain and palpitations.  Gastrointestinal:  Negative for abdominal pain, constipation and nausea.  Genitourinary:  Negative for dysuria and penile discharge.  Musculoskeletal:  Negative for back pain, myalgias and neck pain.       Rt hand numbness symptoms intermitently but none presentlyu.  Skin:  Negative for rash.  Neurological:        Dizziness. See hpi. Happens intermittently but none presently.  Hematological:  Negative for adenopathy. Does not bruise/bleed easily.  Psychiatric/Behavioral:  Negative for behavioral problems and decreased concentration. The patient is not nervous/anxious.          Observations/Objective: General-no acute distress, pleasant, oriented. Lungs- on inspection lungs appear unlabored. Neck- no tracheal deviation or jvd on inspection. Neuro- gross motor function appears intact.  Assessment and Plan: Assessment and Plan          Patient Instructions  Obesity Obesity managed with Wegovy , significant weight loss achieved. New insurance requires preauthorization for Wegovy . Exploring Virta program for alternative options. - Send Wegovy  prescription to pharmacy for insurance verification. - Contact pharmacy to discuss Virta program and if insurance covers wegovy ? - Pt to provide insurance information to pharmacy for further investigation.  Intermittent dizziness Intermittent dizziness. -  Schedule in-office visit for evaluation of dizziness. - Check blood pressure and pulse during visit. - Discuss potential need for finger stick blood sugar  monitor if dizziness persists. -advise if motor or function deficits be seen in ED as would require immediate evaluation and possible imaging.  Intermittent right hand numbness and motor symptoms Intermittent numbness and motor symptoms in right hand, differential diagnosis includes carpal tunnel syndrome or central neurologic condition. - Schedule in-office visit for evaluation of hand symptoms. - Consider tests for carpal tunnel syndrome or central neurologic condition. - Potential referral to neurologist for further evaluation. -If this worsens with dizziness would require ED evaluation as well  Fatigue Reports fatigue and low energy despite normal B12 levels. - Schedule combined wellness exam and evaluation for fatigue. - Consider additional studies if indicated during wellness exam.  Follow up wellness exam within a month. Sooner recommended based on fatigue, dizziness and tingling/numbness of hand    Follow Up Instructions:    I discussed the assessment and treatment plan with the patient. The patient was provided an opportunity to ask questions and all were answered. The patient agreed with the plan and demonstrated an understanding of the instructions.   The patient was advised to call back or seek an in-person evaluation if the symptoms worsen or if the condition fails to improve as anticipated.   Dallas Maxwell, PA-C     Subjective:    Patient ID: SHONE LEVENTHAL, male    DOB: 10/28/1980, 43 y.o.   MRN: [id number]  HPI          Past Medical History:  Diagnosis Date   GERD (gastroesophageal reflux disease)      Social History   Socioeconomic History   Marital status: Married    Spouse name: Not on file   Number of children: Not on file   Years of education: Not on file   Highest education level: GED or equivalent  Occupational History   Not on file  Tobacco Use   Smoking status: Former    Current packs/day: 1.00    Average packs/day: 1 pack/day  for 20.0 years (20.0 ttl pk-yrs)    Types: Cigarettes   Smokeless tobacco: Never  Vaping Use   Vaping status: Every Day   Substances: Nicotine  Substance and Sexual Activity   Alcohol use: No    Alcohol/week: 0.0 standard drinks of alcohol   Drug use: No   Sexual activity: Yes  Other Topics Concern   Not on file  Social History Narrative   Not on file   Social Drivers of Health   Financial Resource Strain: Low Risk  (08/06/2023)   Overall Financial Resource Strain (CARDIA)    Difficulty of Paying Living Expenses: Not very hard  Food Insecurity: Food Insecurity Present (08/06/2023)   Hunger Vital Sign    Worried About Running Out of Food in the Last Year: Sometimes true    Ran Out of Food in the Last Year: Sometimes true  Transportation Needs: No Transportation Needs (08/06/2023)   PRAPARE - Administrator, Civil Service (Medical): No    Lack of Transportation (Non-Medical): No  Physical Activity: Unknown (08/06/2023)   Exercise Vital Sign    Days of Exercise per Week: 0 days    Minutes of Exercise per Session: Not on file  Stress: Stress Concern Present (08/06/2023)   [employer] of Occupational Health - Occupational Stress Questionnaire    Feeling of Stress : To some extent  Social Connections:  Moderately Isolated (08/06/2023)   Social Connection and Isolation Panel    Frequency of Communication with Friends and Family: More than three times a week    Frequency of Social Gatherings with Friends and Family: Once a week    Attends Religious Services: Never    Database administrator or Organizations: No    Attends Engineer, structural: Not on file    Marital Status: Married  Intimate Partner Violence: Unknown (08/25/2021)   Received from [facility]   HITS    Physically Hurt: Not on file    Insult or Talk Down To: Not on file    Threaten Physical Harm: Not on file    Scream or Curse: Not on file    Past Surgical History:  Procedure Laterality  Date   KNEE SURGERY  2008   Left    Family History  Problem Relation Age of Onset   Heart disease Mother    Colon polyps Father    Colon cancer Neg Hx    Esophageal cancer Neg Hx    Rectal cancer Neg Hx    Stomach cancer Neg Hx     No Known Allergies  Current Outpatient Medications on File Prior to Visit  Medication Sig Dispense Refill   omeprazole  (PRILOSEC) 20 MG capsule Take 1 capsule (20 mg total) by mouth daily. 30 capsule 3   Semaglutide -Weight Management 2.4 MG/0.75ML SOAJ Inject 2.4 mg into the skin once a week. 3 mL 3   No current facility-administered medications on file prior to visit.    There were no vitals taken for this visit.        Objective:   Physical Exam        Assessment & Plan:   Patient Instructions  Obesity Obesity managed with Wegovy , significant weight loss achieved. New insurance requires preauthorization for Wegovy . Exploring Virta program for alternative options. - Send Wegovy  prescription to pharmacy for insurance verification. - Contact pharmacy to discuss Virta program and if insurance covers wegovy ? - Pt to provide insurance information to pharmacy for further investigation.  Intermittent dizziness Intermittent dizziness. - Schedule in-office visit for evaluation of dizziness. - Check blood pressure and pulse during visit. - Discuss potential need for finger stick blood sugar monitor if dizziness persists. -advise if motor or function deficits be seen in ED as would require immediate evaluation and possible imaging.  Intermittent right hand numbness and motor symptoms Intermittent numbness and motor symptoms in right hand, differential diagnosis includes carpal tunnel syndrome or central neurologic condition. - Schedule in-office visit for evaluation of hand symptoms. - Consider tests for carpal tunnel syndrome or central neurologic condition. - Potential referral to neurologist for further evaluation. -If this worsens with  dizziness would require ED evaluation as well  Fatigue Reports fatigue and low energy despite normal B12 levels. - Schedule combined wellness exam and evaluation for fatigue. - Consider additional studies if indicated during wellness exam.  Follow up wellness exam within a month. Sooner recommended based on fatigue, dizziness and tingling/numbness of hand   Dallas Maxwell, PA-C

## 2024-01-28 NOTE — Patient Instructions (Signed)
 Obesity Obesity managed with Wegovy , significant weight loss achieved. New insurance requires preauthorization for Wegovy . Exploring Virta program for alternative options. - Send Wegovy  prescription to pharmacy for insurance verification. - Contact pharmacy to discuss Virta program and if insurance covers wegovy ? - Pt to provide insurance information to pharmacy for further investigation.  Intermittent dizziness Intermittent dizziness. - Schedule in-office visit for evaluation of dizziness. - Check blood pressure and pulse during visit. - Discuss potential need for finger stick blood sugar monitor if dizziness persists. -advise if motor or function deficits be seen in ED as would require immediate evaluation and possible imaging.  Intermittent right hand numbness and motor symptoms Intermittent numbness and motor symptoms in right hand, differential diagnosis includes carpal tunnel syndrome or central neurologic condition. - Schedule in-office visit for evaluation of hand symptoms. - Consider tests for carpal tunnel syndrome or central neurologic condition. - Potential referral to neurologist for further evaluation. -If this worsens with dizziness would require ED evaluation as well  Fatigue Reports fatigue and low energy despite normal B12 levels. - Schedule combined wellness exam and evaluation for fatigue. - Consider additional studies if indicated during wellness exam.  Follow up wellness exam within a month. Sooner recommended based on fatigue, dizziness and tingling/numbness of hand

## 2024-01-28 NOTE — Telephone Encounter (Signed)
 No answer on follow up call. Left message.

## 2024-01-29 ENCOUNTER — Encounter: Payer: Self-pay | Admitting: Medical

## 2024-01-31 ENCOUNTER — Other Ambulatory Visit (HOSPITAL_BASED_OUTPATIENT_CLINIC_OR_DEPARTMENT_OTHER): Payer: Self-pay

## 2024-01-31 LAB — SURGICAL PATHOLOGY

## 2024-02-01 ENCOUNTER — Other Ambulatory Visit (HOSPITAL_BASED_OUTPATIENT_CLINIC_OR_DEPARTMENT_OTHER): Payer: Self-pay

## 2024-02-01 ENCOUNTER — Ambulatory Visit: Payer: Self-pay | Admitting: Internal Medicine

## 2024-02-03 ENCOUNTER — Ambulatory Visit: Admitting: Medical

## 2024-02-17 ENCOUNTER — Ambulatory Visit: Admitting: Medical

## 2024-02-23 ENCOUNTER — Other Ambulatory Visit: Payer: Self-pay

## 2024-02-24 ENCOUNTER — Other Ambulatory Visit (HOSPITAL_COMMUNITY): Payer: Self-pay

## 2024-02-24 ENCOUNTER — Encounter (HOSPITAL_COMMUNITY): Payer: Self-pay

## 2024-03-02 ENCOUNTER — Telehealth (HOSPITAL_COMMUNITY): Payer: Self-pay

## 2024-03-02 ENCOUNTER — Other Ambulatory Visit (HOSPITAL_COMMUNITY): Payer: Self-pay

## 2024-03-02 ENCOUNTER — Telehealth (HOSPITAL_COMMUNITY): Payer: Self-pay | Admitting: Pharmacy Technician

## 2024-03-02 ENCOUNTER — Encounter (HOSPITAL_COMMUNITY): Payer: Self-pay

## 2024-03-02 NOTE — Telephone Encounter (Signed)
 PA request has been Received. New Encounter has been or will be created for follow up. For additional info see Pharmacy Prior Auth telephone encounter from 03/02/24.

## 2024-03-02 NOTE — Telephone Encounter (Signed)
 Pharmacy Patient Advocate Encounter   Received notification from Pt Calls Messages that prior authorization for Wegovy  2.4 mg/0.75 ml auto injectors is required/requested.   Insurance verification completed.   The patient is insured through [employer].   Per test claim: Effective October 1st, Medicaid will discontinue coverage of GLP1 medications for weight loss (such as Wegovy  and Zepbound), unless the patient has a documented history of a heart attack or stroke. Zepbound will continue to be covered only for patients with moderate to severe sleep apnea (AHI 15-30) and a BMI greater than 40. Because of this change, the prior authorization team will not be submitting new PA requests for GLP1 medications prescribed for weight loss, as patients will be unable to continue therapy under Medicaid coverage.   *has primary insurance through [place] but they are rejecting as product/service not covered. Will have to be changed to something that either his Primary and/or Medicaid will cover

## 2024-03-03 NOTE — Telephone Encounter (Signed)
 Called pt with no answer did not leave voicemail due to it saying Jon Sanders

## 2024-03-09 ENCOUNTER — Telehealth (INDEPENDENT_AMBULATORY_CARE_PROVIDER_SITE_OTHER): Admitting: Medical

## 2024-03-09 ENCOUNTER — Ambulatory Visit: Admitting: Medical

## 2024-03-09 ENCOUNTER — Other Ambulatory Visit (HOSPITAL_BASED_OUTPATIENT_CLINIC_OR_DEPARTMENT_OTHER): Payer: Self-pay

## 2024-03-09 ENCOUNTER — Encounter: Payer: Self-pay | Admitting: Medical

## 2024-03-09 MED ORDER — PHENTERMINE HCL 15 MG PO CAPS
15.0000 mg | ORAL_CAPSULE | ORAL | 0 refills | Status: DC
  Filled 2024-03-09: qty 30, 30d supply, fill #0

## 2024-03-09 NOTE — Patient Instructions (Signed)
 Obesity Obesity with recent weight gain after stopping GLP-1 agonist due to insurance issues. Previous weight loss with GLP-1 agonist. Phentermine considered for short-term management. Discussed side effects and advised caffeine avoidance initially. - Prescribe phentermine 15 mg, 30 capsules. 1 tab po q day - Instruct to check insurance coverage and cash price for phentermine. - Advise to continue a healthy diet. - Instruct to monitor blood pressure and pulse weekly and report via MyChart. If bp over 140/90 or if resting pulse over 100 let me know. Avoid caffeine. - Schedule follow-up in one month to assess progress and check blood pressure and pulse manually.  Tobacco use (vaping) Currently vaping as a smoking substitute. Expressed desire to quit.  Follow up in one month or sooner if needed.

## 2024-03-09 NOTE — Progress Notes (Signed)
 Subjective:    Patient ID: Jon Sanders, male    DOB: [birth year], 43 y.o.   MRN: [id number]  HPI  Virtual Visit via Video Note  I connected with Jon Sanders on 03/09/24 at 10:20 AM EDT by a video enabled telemedicine application and verified that I am speaking with the correct person using two identifiers.  Location: Patient: [place] Provider: office    I discussed the limitations of evaluation and management by telemedicine and the availability of in person appointments. The patient expressed understanding and agreed to proceed.  History of Present Illness: Jon Sanders is a 44 year old male who presents with difficulty obtaining insurance coverage for GLP-1 medications for weight management. He is accompanied by his wife.  He recently switched from Medicaid to Blue Cross Blue Shield Anthem through his wife's employer, [employer]. Both insurance plans have not covered the GLP-1 medications, including Saxenda and Wegovy , due to plan exclusions. He has previously tried metformin  and Wellbutrin  without success in weight loss.  When on glp1 last year did loose 68 lbs.  He has experienced a weight gain of approximately 10 pounds over the past two weeks since discontinuing the GLP-1 medication, with his current weight being 294 pounds, up from 283 pounds. He has lost a total of 68 pounds previously while on the GLP-1 medication. He is concerned about the cost of paying out of pocket for these medications, which can be very high.  He has a history of smoking but transitioned to vaping a couple of years ago. He is considering quitting vaping. He has tried Wellbutrin  in the past to quit smoking but did not experience weight loss with it.  He is currently taking omeprazole  before sleep. He is not on any other medications at this time.   Observations/Objective: General-no acute distress, pleasant, oriented. Lungs- on inspection lungs appear unlabored. Neck- no tracheal deviation  or jvd on inspection. Neuro- gross motor function appears intact.   Assessment and Plan: Patient Instructions  Obesity Obesity with recent weight gain after stopping GLP-1 agonist due to insurance issues. Previous weight loss with GLP-1 agonist. Phentermine considered for short-term management. Discussed side effects and advised caffeine avoidance initially. - Prescribe phentermine 15 mg, 30 capsules. 1 tab po q day - Instruct to check insurance coverage and cash price for phentermine. - Advise to continue a healthy diet. - Instruct to monitor blood pressure and pulse weekly and report via MyChart. If bp over 140/90 or if resting pulse over 100 let me know. Avoid caffeine. - Schedule follow-up in one month to assess progress and check blood pressure and pulse manually.  Tobacco use (vaping) Currently vaping as a smoking substitute. Expressed desire to quit.  Follow up in one month or sooner if needed.   Follow Up Instructions:    I discussed the assessment and treatment plan with the patient. The patient was provided an opportunity to ask questions and all were answered. The patient agreed with the plan and demonstrated an understanding of the instructions.   The patient was advised to call back or seek an in-person evaluation if the symptoms worsen or if the condition fails to improve as anticipated.     Dallas Maxwell, PA-C       Review of Systems  Constitutional:  Negative for chills and fatigue.  Respiratory:  Negative for chest tightness and wheezing.   Cardiovascular:  Negative for chest pain and palpitations.  Gastrointestinal:  Negative for anal bleeding and constipation.  Genitourinary:  Negative for dysuria and frequency.  Musculoskeletal:  Negative for back pain.  Skin:  Negative for rash.  Neurological:  Negative for dizziness, weakness and numbness.  Hematological:  Negative for adenopathy.  Psychiatric/Behavioral:  Negative for behavioral problems and  decreased concentration.     Past Medical History:  Diagnosis Date   GERD (gastroesophageal reflux disease)      Social History   Socioeconomic History   Marital status: Married    Spouse name: Not on file   Number of children: Not on file   Years of education: Not on file   Highest education level: GED or equivalent  Occupational History   Not on file  Tobacco Use   Smoking status: Former    Current packs/day: 1.00    Average packs/day: 1 pack/day for 20.0 years (20.0 ttl pk-yrs)    Types: Cigarettes   Smokeless tobacco: Never  Vaping Use   Vaping status: Every Day   Substances: Nicotine  Substance and Sexual Activity   Alcohol use: No    Alcohol/week: 0.0 standard drinks of alcohol   Drug use: No   Sexual activity: Yes  Other Topics Concern   Not on file  Social History Narrative   Not on file   Social Drivers of Health   Financial Resource Strain: Low Risk  (08/06/2023)   Overall Financial Resource Strain (CARDIA)    Difficulty of Paying Living Expenses: Not very hard  Food Insecurity: Food Insecurity Present (08/06/2023)   Hunger Vital Sign    Worried About Running Out of Food in the Last Year: Sometimes true    Ran Out of Food in the Last Year: Sometimes true  Transportation Needs: No Transportation Needs (08/06/2023)   PRAPARE - Administrator, Civil Service (Medical): No    Lack of Transportation (Non-Medical): No  Physical Activity: Unknown (08/06/2023)   Exercise Vital Sign    Days of Exercise per Week: 0 days    Minutes of Exercise per Session: Not on file  Stress: Stress Concern Present (08/06/2023)   [employer] of Occupational Health - Occupational Stress Questionnaire    Feeling of Stress : To some extent  Social Connections: Moderately Isolated (08/06/2023)   Social Connection and Isolation Panel    Frequency of Communication with Friends and Family: More than three times a week    Frequency of Social Gatherings with Friends and  Family: Once a week    Attends Religious Services: Never    Database administrator or Organizations: No    Attends Engineer, structural: Not on file    Marital Status: Married  Intimate Partner Violence: Unknown (08/25/2021)   Received from [facility]   HITS    Physically Hurt: Not on file    Insult or Talk Down To: Not on file    Threaten Physical Harm: Not on file    Scream or Curse: Not on file    Past Surgical History:  Procedure Laterality Date   KNEE SURGERY  2008   Left    Family History  Problem Relation Age of Onset   Heart disease Mother    Colon polyps Father    Colon cancer Neg Hx    Esophageal cancer Neg Hx    Rectal cancer Neg Hx    Stomach cancer Neg Hx     No Known Allergies  No current outpatient medications on file prior to visit.   No current facility-administered medications on file prior to  visit.    BP 116/72   Pulse 81   Ht 6' 1 (1.854 m)   Wt 294 lb (133.4 kg)   BMI 38.79 kg/m         Objective:   Physical Exam        Assessment & Plan:   Obesity Obesity with recent weight gain after stopping GLP-1 agonist due to insurance issues. Previous weight loss with GLP-1 agonist. Phentermine considered for short-term management. Discussed side effects and advised caffeine avoidance initially. - Prescribe phentermine 15 mg, 30 capsules. 1 tab po q day - Instruct to check insurance coverage and cash price for phentermine. - Advise to continue a healthy diet. - Instruct to monitor blood pressure and pulse weekly and report via MyChart. If bp over 140/90 or if resting pulse over 100 let me know. Avoid caffeine. - Schedule follow-up in one month to assess progress and check blood pressure and pulse manually.  Tobacco use (vaping) Currently vaping as a smoking substitute. Expressed desire to quit.  Follow up in one month or sooner if needed.

## 2024-03-10 ENCOUNTER — Other Ambulatory Visit: Payer: Self-pay | Admitting: Medical

## 2024-03-10 ENCOUNTER — Other Ambulatory Visit: Payer: Self-pay

## 2024-03-10 ENCOUNTER — Other Ambulatory Visit (HOSPITAL_COMMUNITY): Payer: Self-pay

## 2024-03-10 MED ORDER — WEGOVY 2.4 MG/0.75ML ~~LOC~~ SOAJ
2.4000 mg | SUBCUTANEOUS | 3 refills | Status: DC
  Filled 2024-03-10: qty 3, 28d supply, fill #0

## 2024-03-13 ENCOUNTER — Other Ambulatory Visit (HOSPITAL_COMMUNITY): Payer: Self-pay

## 2024-03-13 ENCOUNTER — Other Ambulatory Visit: Payer: Self-pay

## 2024-03-13 MED ORDER — SEMAGLUTIDE-WEIGHT MANAGEMENT 0.25 MG/0.5ML ~~LOC~~ SOAJ
0.2500 mg | SUBCUTANEOUS | 0 refills | Status: DC
  Filled 2024-03-13 – 2024-03-20 (×4): qty 2, 28d supply, fill #0

## 2024-03-13 NOTE — Addendum Note (Signed)
 Addended by: DORINA DALLAS HERO on: 03/13/2024 07:40 PM   Modules accepted: Orders

## 2024-03-14 ENCOUNTER — Other Ambulatory Visit: Payer: Self-pay

## 2024-03-14 ENCOUNTER — Encounter: Payer: Self-pay | Admitting: Pharmacist

## 2024-03-14 ENCOUNTER — Telehealth (HOSPITAL_COMMUNITY): Payer: Self-pay | Admitting: Pharmacist

## 2024-03-14 ENCOUNTER — Other Ambulatory Visit (HOSPITAL_BASED_OUTPATIENT_CLINIC_OR_DEPARTMENT_OTHER): Payer: Self-pay

## 2024-03-14 ENCOUNTER — Other Ambulatory Visit (HOSPITAL_COMMUNITY): Payer: Self-pay

## 2024-03-14 NOTE — Addendum Note (Signed)
 Addended by: DORINA DALLAS HERO on: 03/14/2024 09:29 AM   Modules accepted: Orders

## 2024-03-15 ENCOUNTER — Other Ambulatory Visit (HOSPITAL_COMMUNITY): Payer: Self-pay

## 2024-03-15 ENCOUNTER — Telehealth (HOSPITAL_COMMUNITY): Payer: Self-pay

## 2024-03-15 NOTE — Telephone Encounter (Signed)
 Pharmacy Patient Advocate Encounter   Received notification from Pt Calls Messages that prior authorization for Wegovy  0.25 mg/0.5 ml auto injectors is required/requested.   Insurance verification completed.   The patient is insured through [employer].   Per test claim: Effective October 1st, Medicaid will discontinue coverage of GLP1 medications for weight loss (such as Wegovy  and Zepbound), unless the patient has a documented history of a heart attack or stroke. Zepbound will continue to be covered only for patients with moderate to severe sleep apnea (AHI 15-30) and a BMI greater than 40. Because of this change, the prior authorization team will not be submitting new PA requests for GLP1 medications prescribed for weight loss, as patients will be unable to continue therapy under Medicaid coverage.

## 2024-03-15 NOTE — Telephone Encounter (Signed)
 PA request has been Received. New Encounter has been or will be created for follow up. For additional info see Pharmacy Prior Auth telephone encounter from 03/15/24.

## 2024-03-16 NOTE — Telephone Encounter (Signed)
 Pharmacy Patient Advocate Encounter   Received notification from Pt Calls Messages that prior authorization for Wegovy  0.25 mg/0.5 ml auto injectors is required/requested.   Insurance verification completed.   The patient is insured through [employer].   Per test claim: Effective October 1st, Medicaid will discontinue coverage of GLP1 medications for weight loss (such as Wegovy  and Zepbound), unless the patient has a documented history of a heart attack or stroke. Zepbound will continue to be covered only for patients with moderate to severe sleep apnea (AHI 15-30) and a BMI greater than 40. Because of this change, the prior authorization team will not be submitting new PA requests for GLP1 medications prescribed for weight loss, as patients will be unable to continue therapy under Medicaid coverage.

## 2024-03-17 ENCOUNTER — Other Ambulatory Visit (HOSPITAL_COMMUNITY): Payer: Self-pay

## 2024-03-20 ENCOUNTER — Other Ambulatory Visit (HOSPITAL_COMMUNITY): Payer: Self-pay

## 2024-03-20 ENCOUNTER — Other Ambulatory Visit: Payer: Self-pay

## 2024-04-21 ENCOUNTER — Encounter: Payer: Self-pay | Admitting: Medical

## 2024-04-24 ENCOUNTER — Other Ambulatory Visit: Payer: Self-pay

## 2024-04-24 MED ORDER — WEGOVY 0.5 MG/0.5ML ~~LOC~~ SOAJ
0.5000 mg | SUBCUTANEOUS | 0 refills | Status: DC
  Filled 2024-04-24: qty 2, 28d supply, fill #0

## 2024-04-24 NOTE — Addendum Note (Signed)
 Addended by: DORINA DALLAS HERO on: 04/24/2024 10:04 AM   Modules accepted: Orders

## 2024-05-19 ENCOUNTER — Other Ambulatory Visit: Payer: Self-pay | Admitting: Medical

## 2024-05-20 ENCOUNTER — Other Ambulatory Visit (HOSPITAL_COMMUNITY): Payer: Self-pay

## 2024-05-20 MED ORDER — WEGOVY 0.5 MG/0.5ML ~~LOC~~ SOAJ
0.5000 mg | SUBCUTANEOUS | 0 refills | Status: AC
  Filled 2024-05-20: qty 2, 28d supply, fill #0

## 2024-05-20 NOTE — Telephone Encounter (Signed)
Rx refill sent to pt pharmacy 

## 2024-05-22 ENCOUNTER — Other Ambulatory Visit (HOSPITAL_BASED_OUTPATIENT_CLINIC_OR_DEPARTMENT_OTHER): Payer: Self-pay

## 2024-05-23 ENCOUNTER — Other Ambulatory Visit: Payer: Self-pay

## 2024-05-23 ENCOUNTER — Other Ambulatory Visit (HOSPITAL_COMMUNITY): Payer: Self-pay

## 2024-06-15 ENCOUNTER — Encounter: Payer: Self-pay | Admitting: Medical

## 2024-06-17 MED ORDER — SEMAGLUTIDE (1 MG/DOSE) 4 MG/3ML ~~LOC~~ SOPN
1.0000 mg | PEN_INJECTOR | SUBCUTANEOUS | 0 refills | Status: AC
  Filled 2024-06-17: qty 3, 28d supply, fill #0

## 2024-06-17 NOTE — Addendum Note (Signed)
 Addended by: DORINA DALLAS DORINA PA-C M on: 06/17/2024 08:19 AM   Modules accepted: Orders

## 2024-06-18 ENCOUNTER — Other Ambulatory Visit: Payer: Self-pay

## 2024-06-18 ENCOUNTER — Telehealth (HOSPITAL_COMMUNITY): Payer: Self-pay

## 2024-06-19 ENCOUNTER — Other Ambulatory Visit (HOSPITAL_BASED_OUTPATIENT_CLINIC_OR_DEPARTMENT_OTHER): Payer: Self-pay

## 2024-06-19 ENCOUNTER — Other Ambulatory Visit (HOSPITAL_COMMUNITY): Payer: Self-pay

## 2024-06-19 ENCOUNTER — Telehealth (HOSPITAL_COMMUNITY): Payer: Self-pay

## 2024-06-19 NOTE — Telephone Encounter (Signed)
 PA request has been Received. New Encounter has been or will be created for follow up. For additional info see Pharmacy Prior Auth telephone encounter from 06/19/24.

## 2024-06-19 NOTE — Telephone Encounter (Signed)
 Pharmacy Patient Advocate Encounter   Received notification from Pt Calls Messages that prior authorization for Ozempic  (1 MG/DOSE) 4MG /3ML pen-injectors  is required/requested.   Insurance verification completed.   The patient is insured through [facility].   Per test claim: PA required; However, NEW/RECENT labs/notes are needed to complete & submit PA request. Please see below.  *We need documentation of Diabetes, with chart notes and A1C labs in order to submit the prior auth. Ozempic  is only approved for diabetes.

## 2024-06-20 NOTE — Telephone Encounter (Signed)
 Called pt to get him scheduled for visit but no answer. Detailed message left for pt to call back and schedule and why

## 2024-06-21 NOTE — Telephone Encounter (Signed)
 Called pt to address his concerns and we did end up scheduling him a vv for Monday 06/26/2024 to follow up with wegovy 

## 2024-06-21 NOTE — Telephone Encounter (Signed)
 Patient wife has called in to see if he really needs an appointment for his wegovy  shot due to him being self pay. I advised her that Dr Dorina does want to see and speak with him regarding medications and other things as well. She wanted me to ask due to the weather if the appointment can be done by video? Please contact patient back to discuss if it can be done by video or if he has to come in office.   Phone number: [phone]

## 2024-06-26 ENCOUNTER — Encounter (HOSPITAL_BASED_OUTPATIENT_CLINIC_OR_DEPARTMENT_OTHER): Payer: Self-pay

## 2024-06-26 ENCOUNTER — Other Ambulatory Visit (HOSPITAL_BASED_OUTPATIENT_CLINIC_OR_DEPARTMENT_OTHER): Payer: Self-pay

## 2024-06-26 ENCOUNTER — Ambulatory Visit: Admitting: Medical

## 2024-06-26 MED ORDER — WEGOVY 1 MG/0.5ML ~~LOC~~ SOAJ
1.0000 mg | SUBCUTANEOUS | 0 refills | Status: AC
  Filled 2024-06-26: qty 2, 28d supply, fill #0

## 2024-06-27 ENCOUNTER — Other Ambulatory Visit (HOSPITAL_BASED_OUTPATIENT_CLINIC_OR_DEPARTMENT_OTHER): Payer: Self-pay

## 2024-06-28 ENCOUNTER — Other Ambulatory Visit: Payer: Self-pay

## 2024-06-30 ENCOUNTER — Other Ambulatory Visit (HOSPITAL_BASED_OUTPATIENT_CLINIC_OR_DEPARTMENT_OTHER): Payer: Self-pay

## 2024-07-03 ENCOUNTER — Ambulatory Visit: Admitting: Medical
